# Patient Record
Sex: Female | Born: 1990 | Race: White | Hispanic: No | Marital: Single | Smoking: Never smoker
Health system: Southern US, Community
[De-identification: ages and names within clinical notes are randomized; demographics above are authoritative.]

## PROBLEM LIST (undated history)

## (undated) DIAGNOSIS — F329 Major depressive disorder, single episode, unspecified: Secondary | ICD-10-CM

## (undated) DIAGNOSIS — F319 Bipolar disorder, unspecified: Secondary | ICD-10-CM

## (undated) DIAGNOSIS — O24419 Gestational diabetes mellitus in pregnancy, unspecified control: Secondary | ICD-10-CM

## (undated) DIAGNOSIS — F909 Attention-deficit hyperactivity disorder, unspecified type: Secondary | ICD-10-CM

## (undated) DIAGNOSIS — F32A Depression, unspecified: Secondary | ICD-10-CM

---
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
## 2012-11-14 DIAGNOSIS — E669 Obesity, unspecified: Secondary | ICD-10-CM | POA: Diagnosis present

## 2012-11-14 DIAGNOSIS — Z3009 Encounter for other general counseling and advice on contraception: Secondary | ICD-10-CM | POA: Insufficient documentation

## 2012-11-14 DIAGNOSIS — F988 Other specified behavioral and emotional disorders with onset usually occurring in childhood and adolescence: Secondary | ICD-10-CM | POA: Diagnosis present

## 2012-11-14 DIAGNOSIS — L02419 Cutaneous abscess of limb, unspecified: Secondary | ICD-10-CM

## 2012-11-14 HISTORY — DX: Cutaneous abscess of limb, unspecified: L02.419

## 2013-03-02 DIAGNOSIS — Z8659 Personal history of other mental and behavioral disorders: Secondary | ICD-10-CM

## 2013-03-02 DIAGNOSIS — Z8614 Personal history of Methicillin resistant Staphylococcus aureus infection: Secondary | ICD-10-CM | POA: Insufficient documentation

## 2013-03-02 DIAGNOSIS — O99340 Other mental disorders complicating pregnancy, unspecified trimester: Secondary | ICD-10-CM | POA: Insufficient documentation

## 2014-07-12 DIAGNOSIS — R825 Elevated urine levels of drugs, medicaments and biological substances: Secondary | ICD-10-CM | POA: Insufficient documentation

## 2017-01-06 ENCOUNTER — Other Ambulatory Visit (HOSPITAL_COMMUNITY): Payer: Self-pay | Admitting: *Deleted

## 2017-01-06 DIAGNOSIS — N6452 Nipple discharge: Secondary | ICD-10-CM

## 2017-01-16 ENCOUNTER — Other Ambulatory Visit (HOSPITAL_COMMUNITY): Payer: Self-pay | Admitting: Obstetrics and Gynecology

## 2017-01-16 DIAGNOSIS — N6452 Nipple discharge: Secondary | ICD-10-CM

## 2017-01-17 ENCOUNTER — Ambulatory Visit (HOSPITAL_COMMUNITY): Payer: Self-pay

## 2017-01-17 ENCOUNTER — Other Ambulatory Visit: Payer: Self-pay

## 2017-04-09 ENCOUNTER — Emergency Department (HOSPITAL_BASED_OUTPATIENT_CLINIC_OR_DEPARTMENT_OTHER)
Admission: EM | Admit: 2017-04-09 | Discharge: 2017-04-09 | Disposition: A | Discharge status: Home / Self Care | Payer: Self-pay | Attending: Emergency Medicine | Admitting: Emergency Medicine

## 2017-04-09 ENCOUNTER — Encounter (HOSPITAL_BASED_OUTPATIENT_CLINIC_OR_DEPARTMENT_OTHER): Payer: Self-pay | Admitting: Emergency Medicine

## 2017-04-09 DIAGNOSIS — F17200 Nicotine dependence, unspecified, uncomplicated: Secondary | ICD-10-CM | POA: Insufficient documentation

## 2017-04-09 DIAGNOSIS — N898 Other specified noninflammatory disorders of vagina: Secondary | ICD-10-CM | POA: Insufficient documentation

## 2017-04-09 DIAGNOSIS — N939 Abnormal uterine and vaginal bleeding, unspecified: Secondary | ICD-10-CM | POA: Insufficient documentation

## 2017-04-09 DIAGNOSIS — R112 Nausea with vomiting, unspecified: Secondary | ICD-10-CM

## 2017-04-09 DIAGNOSIS — R197 Diarrhea, unspecified: Secondary | ICD-10-CM | POA: Insufficient documentation

## 2017-04-09 LAB — I-STAT CHEM 8, ED
BUN: 10 mg/dL (ref 6–20)
CREATININE: 0.8 mg/dL (ref 0.44–1.00)
Calcium, Ion: 1.24 mmol/L (ref 1.15–1.40)
Chloride: 104 mmol/L (ref 101–111)
Glucose, Bld: 103 mg/dL — ABNORMAL HIGH (ref 65–99)
HEMATOCRIT: 36 % (ref 36.0–46.0)
HEMOGLOBIN: 12.2 g/dL (ref 12.0–15.0)
Potassium: 3.1 mmol/L — ABNORMAL LOW (ref 3.5–5.1)
SODIUM: 141 mmol/L (ref 135–145)
TCO2: 25 mmol/L (ref 0–100)

## 2017-04-09 LAB — CBC WITH DIFFERENTIAL/PLATELET
BASOS ABS: 0 10*3/uL (ref 0.0–0.1)
BASOS PCT: 0 %
EOS ABS: 0.1 10*3/uL (ref 0.0–0.7)
Eosinophils Relative: 1 %
HCT: 35.2 % — ABNORMAL LOW (ref 36.0–46.0)
HEMOGLOBIN: 11.9 g/dL — AB (ref 12.0–15.0)
Lymphocytes Relative: 30 %
Lymphs Abs: 2.7 10*3/uL (ref 0.7–4.0)
MCH: 29.6 pg (ref 26.0–34.0)
MCHC: 33.8 g/dL (ref 30.0–36.0)
MCV: 87.6 fL (ref 78.0–100.0)
Monocytes Absolute: 0.9 10*3/uL (ref 0.1–1.0)
Monocytes Relative: 10 %
NEUTROS PCT: 59 %
Neutro Abs: 5.3 10*3/uL (ref 1.7–7.7)
Platelets: 256 10*3/uL (ref 150–400)
RBC: 4.02 MIL/uL (ref 3.87–5.11)
RDW: 15.4 % (ref 11.5–15.5)
WBC: 9 10*3/uL (ref 4.0–10.5)

## 2017-04-09 LAB — URINALYSIS, ROUTINE W REFLEX MICROSCOPIC
Bilirubin Urine: NEGATIVE
GLUCOSE, UA: NEGATIVE mg/dL
Ketones, ur: NEGATIVE mg/dL
Leukocytes, UA: NEGATIVE
Nitrite: NEGATIVE
Protein, ur: NEGATIVE mg/dL
SPECIFIC GRAVITY, URINE: 1.027 (ref 1.005–1.030)
pH: 6 (ref 5.0–8.0)

## 2017-04-09 LAB — URINALYSIS, MICROSCOPIC (REFLEX)

## 2017-04-09 LAB — WET PREP, GENITAL
CLUE CELLS WET PREP: NONE SEEN
Sperm: NONE SEEN
Trich, Wet Prep: NONE SEEN
Yeast Wet Prep HPF POC: NONE SEEN

## 2017-04-09 LAB — RAPID HIV SCREEN (HIV 1/2 AB+AG)
HIV 1/2 ANTIBODIES: NONREACTIVE
HIV-1 P24 ANTIGEN - HIV24: NONREACTIVE

## 2017-04-09 LAB — PREGNANCY, URINE: Preg Test, Ur: NEGATIVE

## 2017-04-09 MED ORDER — ONDANSETRON HCL 4 MG PO TABS: 4.0000 mg | ORAL_TABLET | Freq: Three times a day (TID) | ORAL | 0 refills | Status: DC | PRN

## 2017-04-09 MED ORDER — SODIUM CHLORIDE 0.9 % IV BOLUS (SEPSIS)
1000.0000 mL | Freq: Once | INTRAVENOUS | Status: AC
  Administered 2017-04-09: 1000 mL via INTRAVENOUS

## 2017-04-09 MED ORDER — ONDANSETRON HCL 4 MG/2ML IJ SOLN
4.0000 mg | Freq: Once | INTRAMUSCULAR | Status: AC
  Administered 2017-04-09: 4 mg via INTRAVENOUS
  Filled 2017-04-09: qty 2

## 2017-04-09 NOTE — ED Provider Notes (Signed)
[facility] Provider [facility]   CSN: [id number] Arrival date & time: 04/09/17  4098     History   Chief Complaint Chief Complaint  Patient presents with  . Emesis    HPI Angela [name] is a 26 y.o. female.  The history is provided by the patient and medical records.  Emesis   This is a new problem. The current episode started 2 days ago. The problem occurs continuously. The problem has not changed since onset.The emesis has an appearance of stomach contents. There has been no fever. Associated symptoms include abdominal pain and diarrhea. Pertinent negatives include no chills, no cough, no fever, no headaches, no myalgias, no sweats and no URI. Risk factors include ill contacts.  Diarrhea   This is a new problem. The current episode started 2 days ago. The problem occurs continuously. The problem has not changed since onset.The stool consistency is described as watery. There has been no fever. Associated symptoms include abdominal pain and vomiting. Pertinent negatives include no chills, no sweats, no headaches, no myalgias, no URI and no cough. She has tried nothing for the symptoms. Risk factors include ill contacts.    History reviewed. No pertinent past medical history.  There are no active problems to display for this patient.   History reviewed. No pertinent surgical history.  OB History    No data available       Home Medications    Prior to Admission medications   Not on File    Family History No family history on file.  Social History Social History  Substance Use Topics  . Smoking status: Current Every Day Smoker  . Smokeless tobacco: Never Used  . Alcohol use Yes     Allergies   Patient has no known allergies.   Review of Systems Review of Systems  Constitutional: Negative for appetite change, chills, diaphoresis, fatigue and fever.  HENT: Negative for congestion and rhinorrhea.   Eyes: Negative for visual disturbance.  Respiratory:  Negative for cough, choking, chest tightness, shortness of breath and wheezing.   Cardiovascular: Negative for chest pain and palpitations.  Gastrointestinal: Positive for abdominal pain, diarrhea, nausea and vomiting. Negative for constipation.  Genitourinary: Positive for vaginal bleeding and vaginal discharge. Negative for dysuria.  Musculoskeletal: Negative for back pain, myalgias, neck pain and neck stiffness.  Skin: Negative for rash and wound.  Neurological: Negative for dizziness, syncope, light-headedness, numbness and headaches.  Psychiatric/Behavioral: Negative for agitation.  All other systems reviewed and are negative.    Physical Exam Updated Vital Signs BP 120/73   Pulse 73   Temp 97.9 F (36.6 C) (Oral)   Resp 18   Ht  (1.626 m)   Wt 200 lb (90.7 kg)   SpO2 98%   BMI 34.33 kg/m   Physical Exam  Constitutional: She is oriented to person, place, and time. She appears well-developed and well-nourished. No distress.  HENT:  Head: Normocephalic and atraumatic.  Mouth/Throat: Oropharynx is clear and moist. No oropharyngeal exudate.  Eyes: Conjunctivae and EOM are normal. Pupils are equal, round, and reactive to light.  Neck: Neck supple.  Cardiovascular: Normal rate and regular rhythm.   No murmur heard. Pulmonary/Chest: Effort normal and breath sounds normal. No respiratory distress.  Abdominal: Soft. There is no tenderness. There is no rebound.  Genitourinary: Pelvic exam was performed with patient supine. There is no tenderness on the right labia. There is no tenderness on the left labia. Cervix exhibits discharge (clear). Cervix exhibits no  motion tenderness. Right adnexum displays no mass, no tenderness and no fullness. Left adnexum displays no mass, no tenderness and no fullness. There is bleeding in the vagina. No erythema or tenderness in the vagina. No foreign body in the vagina. Vaginal discharge found.  Musculoskeletal: Normal range of motion. She  exhibits no edema or tenderness.  Neurological: She is alert and oriented to person, place, and time. No sensory deficit. She exhibits normal muscle tone. Coordination normal.  Skin: Skin is warm and dry. Capillary refill takes less than 2 seconds. No rash noted. She is not diaphoretic. No erythema.  Psychiatric: She has a normal mood and affect. Her behavior is normal.  Nursing note and vitals reviewed.    ED Treatments / Results  Labs (all labs ordered are listed, but only abnormal results are displayed) Labs Reviewed  WET PREP, GENITAL - Abnormal; Notable for the following:       Result Value   WBC, Wet Prep HPF POC MANY (*)    All other components within normal limits  URINALYSIS, ROUTINE W REFLEX MICROSCOPIC - Abnormal; Notable for the following:    Hgb urine dipstick LARGE (*)    All other components within normal limits  URINALYSIS, MICROSCOPIC (REFLEX) - Abnormal; Notable for the following:    Bacteria, UA FEW (*)    Squamous Epithelial / LPF 0-5 (*)    All other components within normal limits  CBC WITH DIFFERENTIAL/PLATELET - Abnormal; Notable for the following:    Hemoglobin 11.9 (*)    HCT 35.2 (*)    All other components within normal limits  I-STAT CHEM 8, ED - Abnormal; Notable for the following:    Potassium 3.1 (*)    Glucose, Bld 103 (*)    All other components within normal limits  PREGNANCY, URINE  RAPID HIV SCREEN (HIV 1/2 AB+AG)  RPR  HIV ANTIBODY (ROUTINE TESTING)  GC/CHLAMYDIA PROBE AMP ([place]) NOT AT [facility]    EKG  EKG Interpretation None       Radiology No results found.  Procedures Procedures (including critical care time)  Medications Ordered in ED Medications  sodium chloride 0.9 % bolus 1,000 mL (0 mLs Intravenous Stopped 04/09/17 1126)  ondansetron (ZOFRAN) injection 4 mg (4 mg Intravenous Given 04/09/17 0806)     Initial Impression / Assessment and Plan / ED Course  I have reviewed the triage vital signs and the nursing  notes.  Pertinent labs & imaging results that were available during my care of the patient were reviewed by me and considered in my medical decision making (see chart for details).     Angela [name] is a 26 y.o. female with no significant past medical history who presents with nausea, vomiting, and diarrhea for the last 3 days. Patient says that "everyone at work" has been sick with similar symptoms. She also reports that she has had some vaginal discharge for the last month. She reports that she has an IUD in place for the last 5 years and needs to have a change. She denies any pelvic pain but does report some mild clear vaginal discharge. Patient reports a mild abdominal cramping associated with her vomiting and diarrhea. She denies any bleeding in her emesis or diarrhea. She denies any other symptoms. She denies fevers, chills, chest pain, shortness of breath, cough. She denies any syncopal episodes. She has not taken medicine to help her symptoms.  On exam, patient had no abdominal tenderness. Pelvic exam was performed and some mild bleeding  was seen by from the os. No lacerations or injury seen. Clear discharge present. Swab sent for evaluation. No adnexal tenderness or cervical motion tenderness present. Physical exam otherwise unremarkable with no CVA tenderness and clear lungs.  Patient will have laboratory testing to look for dehydration or electrolyte abnormalities in the setting of her nausea, vomiting, and diarrhea. Urinalysis, print success, and STI testing will be performed. Patient wanted to be "tested for everything sexually". She denies any sexual contacts and has had 1 partner for the last 2 years.  Patient given fluids and nausea medicine during workup. Anticipate discharge after workup.  Diagnostic testing results are seen above. No evidence of DVT or trichomoniasis. No yeast infection. GC/chlamydia swab sent. RPR and HIV test sent. Patient will follow up with PCP for those  results.  Pelvic exam revealed no CMT or adnexal tenderness. Patient had mild bleeding and clear discharge. Patient will follow up with OB/GYN for further management of this and her IUD.  Other lab testing reassuring. Patient had slightly decreased potassium. Patient advised to improve diet. Urinalysis showed no infection.  Patient had resolution of nausea and vomiting with fluids and nausea medications.  Suspect a viral gastroenteritis causing her symptoms. Patient given instructions to follow up with a PCP. Patient understood strict return precautions. Patient had no other questions or concerns and patient was discharged in good condition.   Final Clinical Impressions(s) / ED Diagnoses   Final diagnoses:  Nausea vomiting and diarrhea    New Prescriptions Discharge Medication List as of 04/09/2017 11:26 AM    START taking these medications   Details  ondansetron (ZOFRAN) 4 MG tablet Take 1 tablet (4 mg total) by mouth every 8 (eight) hours as needed., Starting Wed 04/09/2017, Print        Clinical Impression: 1. Nausea vomiting and diarrhea     Disposition: Discharge  Condition: Good  I have discussed the results, Dx and Tx plan with the pt(& family if present). He/she/they expressed understanding and agree(s) with the plan. Discharge instructions discussed at great length. Strict return precautions discussed and pt &/or family have verbalized understanding of the instructions. No further questions at time of discharge.    Discharge Medication List as of 04/09/2017 11:26 AM    START taking these medications   Details  ondansetron (ZOFRAN) 4 MG tablet Take 1 tablet (4 mg total) by mouth every 8 (eight) hours as needed., Starting Wed 04/09/2017, Print        Follow Up: [facility] [address] [place] [address] [phone] Schedule an appointment as soon as possible for a visit    [facility] [place] EMERGENCY  DEPARTMENT [address] [phone] mc [address] [place] [address] [phone]  If symptoms worsen  [facility] [address] [place] [address] [phone] Schedule an appointment as soon as possible for a visit       Heide Scales, MD 04/09/17 [phone]

## 2017-04-09 NOTE — ED Notes (Signed)
Pt requesting work note in triage

## 2017-04-09 NOTE — ED Notes (Signed)
Pt c/o n/v/d that was unrelieved by pepto bismol.  Pt is in no acute distress, mucosa is moist, she is drinking mountain dew and lying on her stomach on the stretcher, playing on phone.

## 2017-04-09 NOTE — ED Notes (Signed)
Pt reports diarrhea and vomiting yesterday, followed by abd pain.

## 2017-04-09 NOTE — Discharge Instructions (Signed)
Please take the nausea medicine to help with your symptoms. Please stay hydrated. Please continue to wash her hands to prevent spread of her likely viral gastroenteritis. Please rest. Please follow-up with a PCP as well as an OB/GYN for further management of your IUD. If any symptoms change or worsen, please return to the nearest emergency department.

## 2017-04-09 NOTE — ED Triage Notes (Signed)
Pt with n/v/d and chills

## 2017-04-10 LAB — RPR: RPR Ser Ql: NONREACTIVE

## 2017-04-10 LAB — HIV ANTIBODY (ROUTINE TESTING W REFLEX): HIV SCREEN 4TH GENERATION: NONREACTIVE

## 2017-04-11 LAB — GC/CHLAMYDIA PROBE AMP (~~LOC~~) NOT AT ARMC
Chlamydia: NEGATIVE
NEISSERIA GONORRHEA: NEGATIVE

## 2018-02-13 ENCOUNTER — Emergency Department (HOSPITAL_COMMUNITY): Payer: Self-pay

## 2018-02-13 ENCOUNTER — Encounter (HOSPITAL_COMMUNITY): Payer: Self-pay

## 2018-02-13 ENCOUNTER — Other Ambulatory Visit: Payer: Self-pay

## 2018-02-13 ENCOUNTER — Emergency Department (HOSPITAL_COMMUNITY)
Admission: EM | Admit: 2018-02-13 | Discharge: 2018-02-13 | Disposition: A | Discharge status: Home / Self Care | Payer: Self-pay | Attending: Emergency Medicine | Admitting: Emergency Medicine

## 2018-02-13 DIAGNOSIS — Y999 Unspecified external cause status: Secondary | ICD-10-CM | POA: Insufficient documentation

## 2018-02-13 DIAGNOSIS — X500XXA Overexertion from strenuous movement or load, initial encounter: Secondary | ICD-10-CM | POA: Insufficient documentation

## 2018-02-13 DIAGNOSIS — Y9389 Activity, other specified: Secondary | ICD-10-CM | POA: Insufficient documentation

## 2018-02-13 DIAGNOSIS — F17210 Nicotine dependence, cigarettes, uncomplicated: Secondary | ICD-10-CM | POA: Insufficient documentation

## 2018-02-13 DIAGNOSIS — A64 Unspecified sexually transmitted disease: Secondary | ICD-10-CM | POA: Insufficient documentation

## 2018-02-13 DIAGNOSIS — Y92018 Other place in single-family (private) house as the place of occurrence of the external cause: Secondary | ICD-10-CM | POA: Insufficient documentation

## 2018-02-13 DIAGNOSIS — S8391XA Sprain of unspecified site of right knee, initial encounter: Secondary | ICD-10-CM | POA: Insufficient documentation

## 2018-02-13 HISTORY — DX: Depression, unspecified: F32.A

## 2018-02-13 HISTORY — DX: Major depressive disorder, single episode, unspecified: F32.9

## 2018-02-13 HISTORY — DX: Bipolar disorder, unspecified: F31.9

## 2018-02-13 LAB — WET PREP, GENITAL
SPERM: NONE SEEN
TRICH WET PREP: NONE SEEN
Yeast Wet Prep HPF POC: NONE SEEN

## 2018-02-13 LAB — URINALYSIS, ROUTINE W REFLEX MICROSCOPIC
Bilirubin Urine: NEGATIVE
Glucose, UA: NEGATIVE mg/dL
Ketones, ur: 20 mg/dL — AB
LEUKOCYTES UA: NEGATIVE
Nitrite: POSITIVE — AB
PH: 6 (ref 5.0–8.0)
Protein, ur: NEGATIVE mg/dL
SPECIFIC GRAVITY, URINE: 1.023 (ref 1.005–1.030)

## 2018-02-13 LAB — PREGNANCY, URINE: PREG TEST UR: NEGATIVE

## 2018-02-13 MED ORDER — LIDOCAINE HCL 1 % IJ SOLN
INTRAMUSCULAR | Status: AC
  Administered 2018-02-13: 17:00:00
  Filled 2018-02-13: qty 20

## 2018-02-13 MED ORDER — DOXYCYCLINE HYCLATE 100 MG PO TABS
100.0000 mg | ORAL_TABLET | Freq: Once | ORAL | Status: AC
  Administered 2018-02-13: 100 mg via ORAL
  Filled 2018-02-13: qty 1

## 2018-02-13 MED ORDER — CEFTRIAXONE SODIUM 250 MG IJ SOLR
250.0000 mg | Freq: Once | INTRAMUSCULAR | Status: AC
  Administered 2018-02-13: 250 mg via INTRAMUSCULAR
  Filled 2018-02-13: qty 250

## 2018-02-13 MED ORDER — METRONIDAZOLE 500 MG PO TABS
500.0000 mg | ORAL_TABLET | Freq: Once | ORAL | Status: AC
  Administered 2018-02-13: 500 mg via ORAL
  Filled 2018-02-13: qty 1

## 2018-02-13 MED ORDER — METRONIDAZOLE 500 MG PO TABS: 500.0000 mg | ORAL_TABLET | Freq: Two times a day (BID) | ORAL | 0 refills | Status: DC

## 2018-02-13 MED ORDER — IBUPROFEN 800 MG PO TABS: 800.0000 mg | ORAL_TABLET | Freq: Three times a day (TID) | ORAL | 0 refills | Status: DC

## 2018-02-13 MED ORDER — IBUPROFEN 800 MG PO TABS
800.0000 mg | ORAL_TABLET | Freq: Once | ORAL | Status: AC
Start: 2018-02-13 — End: 2018-02-13
  Administered 2018-02-13: 800 mg via ORAL
  Filled 2018-02-13: qty 1

## 2018-02-13 MED ORDER — DOXYCYCLINE HYCLATE 100 MG PO CAPS: 100.0000 mg | ORAL_CAPSULE | Freq: Two times a day (BID) | ORAL | 0 refills | Status: DC

## 2018-02-13 NOTE — ED Provider Notes (Signed)
[facility]-EMERGENCY DEPT Provider Note   CSN: [id number] Arrival date & time: 02/13/18  1053     History   Chief Complaint Chief Complaint  Patient presents with  . Knee Pain  . Vaginal Discharge    HPI Angela [name] is a 27 y.o. female.  HPI Patient has 2 complaints.  She reports yesterday she was caring a load of wood down the basement stairs.  She misjudged the last step causing her right knee to get bent way back.  She reports she felt a snap.  It was painful but she was able to go to work that night.  She reports after long-standing it became very painful.  Reports today is almost too painful to put weight on it.  Patient also reports that her boyfriend just let her know that he tested positive for chlamydia.  She reports she had some vaginal discharge for about 2 weeks.  She reports mild lower abdominal pain.  She however reports that she is on her menstrual cycle right now and also she suspects she has endometriosis because she has frequent pelvic pain and her mother has that.  She reports due to limited finances she never really seeks ongoing medical care evaluation. Past Medical History:  Diagnosis Date  . Bipolar 1 disorder (HCC)   . Depression     There are no active problems to display for this patient.   History reviewed. No pertinent surgical history.  OB History    No data available       Home Medications    Prior to Admission medications   Medication Sig Start Date End Date Taking? Authorizing Provider  doxycycline (VIBRAMYCIN) 100 MG capsule Take 1 capsule (100 mg total) by mouth 2 (two) times daily. One po bid x 7 days 02/13/18   Arby BarrettePfeiffer, Tearah Saulsbury, MD  ibuprofen (ADVIL,MOTRIN) 800 MG tablet Take 1 tablet (800 mg total) by mouth 3 (three) times daily. 02/13/18   Arby BarrettePfeiffer, Keiara Sneeringer, MD  metroNIDAZOLE (FLAGYL) 500 MG tablet Take 1 tablet (500 mg total) by mouth 2 (two) times daily. One po bid x 7 days 02/13/18   Arby BarrettePfeiffer, Shyenne Maggard, MD  ondansetron  (ZOFRAN) 4 MG tablet Take 1 tablet (4 mg total) by mouth every 8 (eight) hours as needed. 04/09/17   Tegeler, Canary Brimhristopher J, MD    Family History Family History  Problem Relation Age of Onset  . COPD Mother   . Hypertension Father     Social History Social History   Tobacco Use  . Smoking status: Current Every Day Smoker    Packs/day: 0.15    Types: Cigarettes  . Smokeless tobacco: Never Used  Substance Use Topics  . Alcohol use: Yes    Comment: every weekend  . Drug use: No     Allergies   Patient has no known allergies.   Review of Systems Review of Systems 10 Systems reviewed and are negative for acute change except as noted in the HPI.   Physical Exam Updated Vital Signs BP 100/79   Pulse 63   Temp 98 F (36.7 C) (Oral)   Resp (!) 22   Ht 5\' 4"  (1.626 m)   Wt 90.7 kg (200 lb)   LMP 02/11/2018   SpO2 99%   BMI 34.33 kg/m   Physical Exam  Constitutional: She is oriented to person, place, and time. She appears well-developed and well-nourished. No distress.  HENT:  Head: Normocephalic and atraumatic.  Eyes: Conjunctivae and EOM are normal.  Neck:  Neck supple.  Cardiovascular: Normal rate, regular rhythm and normal heart sounds.  No murmur heard. Pulmonary/Chest: Effort normal and breath sounds normal. No respiratory distress.  Abdominal: Soft. She exhibits no distension. There is no tenderness. There is no guarding.  Genitourinary:  Genitourinary Comments: Normal external female genitalia.  Speculum small amount of yellow discharge and blood from the cervix.  No pooling drainage or discharge.  Bimanual mild uterine tenderness.  Adnexa nontender.  Musculoskeletal: Normal range of motion. She exhibits tenderness. She exhibits no edema.  Right knee is normal to visual inspection.  No joint effusion or erythema.  Patient endorses tenderness along the patellar tendon and the lateral joint lines.  She does not have tenderness with flexion and extension range of  motion.  Patient is able to flex her knee and position it for pelvic exam.  She also has good strength to use the leg symmetrically to elevate her hips and push her full body weight back up into the stretcher.  Neurological: She is alert and oriented to person, place, and time. She exhibits normal muscle tone. Coordination normal.  Skin: Skin is warm and dry.  Psychiatric: She has a normal mood and affect.  Nursing note and vitals reviewed.    ED Treatments / Results  Labs (all labs ordered are listed, but only abnormal results are displayed) Labs Reviewed  URINALYSIS, ROUTINE W REFLEX MICROSCOPIC - Abnormal; Notable for the following components:      Result Value   Hgb urine dipstick MODERATE (*)    Ketones, ur 20 (*)    Nitrite POSITIVE (*)    Bacteria, UA MANY (*)    Squamous Epithelial / LPF 0-5 (*)    All other components within normal limits  WET PREP, GENITAL  PREGNANCY, URINE  RPR  HIV ANTIBODY (ROUTINE TESTING)  GC/CHLAMYDIA PROBE AMP () NOT AT [facility]    EKG  EKG Interpretation None       Radiology Dg Knee Complete 4 Views Right  Result Date: 02/13/2018 CLINICAL DATA:  Pt reports hyperextension of right knee when she fell off a truck 1 days ago - pain through out joint - pt states unable to keep leg straight EXAM: RIGHT KNEE - COMPLETE 4+ VIEW COMPARISON:  None. FINDINGS: No evidence of fracture, dislocation, or joint effusion. No evidence of arthropathy or other focal bone abnormality. Soft tissues are unremarkable. IMPRESSION: Negative. Electronically Signed   By: Bary Richard M.D.   On: 02/13/2018 11:49    Procedures Procedures (including critical care time)  Medications Ordered in ED Medications  lidocaine (XYLOCAINE) 1 % (with pres) injection (not administered)  cefTRIAXone (ROCEPHIN) injection 250 mg (250 mg Intramuscular Given 02/13/18 1556)  doxycycline (VIBRA-TABS) tablet 100 mg (100 mg Oral Given 02/13/18 1556)  metroNIDAZOLE (FLAGYL) tablet 500  mg (500 mg Oral Given 02/13/18 1556)  ibuprofen (ADVIL,MOTRIN) tablet 800 mg (800 mg Oral Given 02/13/18 1556)     Initial Impression / Assessment and Plan / ED Course  I have reviewed the triage vital signs and the nursing notes.  Pertinent labs & imaging results that were available during my care of the patient were reviewed by me and considered in my medical decision making (see chart for details).     Final Clinical Impressions(s) / ED Diagnoses   Final diagnoses:  STD (female)  Sprain of right knee, unspecified ligament, initial encounter   Reports she sprained her knee yesterday at home walking downstairs.  X-ray does not show any acute fracture.  Patient has good range of motion.  Patient does not have active effusion.  Pain is most precipitated by weightbearing.  Patient be placed in a knee sleeve and crutches for comfort.  Patient presents reporting that her boyfriend told her he tested positive chlamydia.  She will be treated empirically for STD.  She describes about 2 weeks of vaginal discharge.  Pelvic has mild uterine tenderness but not adnexal tenderness.  Patient is afebrile and clinically well in appearance.  We will continue outpatient treatment. ED Discharge Orders        Ordered    doxycycline (VIBRAMYCIN) 100 MG capsule  2 times daily     02/13/18 1518    metroNIDAZOLE (FLAGYL) 500 MG tablet  2 times daily     02/13/18 1518    ibuprofen (ADVIL,MOTRIN) 800 MG tablet  3 times daily     02/13/18 1518       Arby Barrette, MD 02/13/18 [phone]

## 2018-02-13 NOTE — ED Notes (Signed)
Bed: WTR6 Expected date:  Expected time:  Means of arrival:  Comments: 

## 2018-02-13 NOTE — ED Triage Notes (Signed)
Patient states she was stepping off of a pallet and hyperextended her righ. patient has increased pain in the right knee with weight bearing. Patient also reports that her boyfriend tested + for chlamydia. Patient reports a foul smelling yellow vaginal drainage x 2 weeks.

## 2018-02-14 LAB — HIV ANTIBODY (ROUTINE TESTING W REFLEX): HIV SCREEN 4TH GENERATION: NONREACTIVE

## 2018-02-14 LAB — RPR: RPR: NONREACTIVE

## 2018-02-16 LAB — GC/CHLAMYDIA PROBE AMP (~~LOC~~) NOT AT ARMC
Chlamydia: POSITIVE — AB
Neisseria Gonorrhea: NEGATIVE

## 2018-03-22 DIAGNOSIS — T839XXA Unspecified complication of genitourinary prosthetic device, implant and graft, initial encounter: Secondary | ICD-10-CM | POA: Insufficient documentation

## 2018-11-24 DIAGNOSIS — R45851 Suicidal ideations: Secondary | ICD-10-CM

## 2018-11-24 DIAGNOSIS — F603 Borderline personality disorder: Secondary | ICD-10-CM

## 2018-11-24 HISTORY — DX: Suicidal ideations: R45.851

## 2018-11-24 HISTORY — DX: Borderline personality disorder: F60.3

## 2018-11-25 DIAGNOSIS — F3181 Bipolar II disorder: Secondary | ICD-10-CM | POA: Diagnosis present

## 2018-11-25 DIAGNOSIS — F1210 Cannabis abuse, uncomplicated: Secondary | ICD-10-CM | POA: Diagnosis present

## 2019-06-20 ENCOUNTER — Encounter (HOSPITAL_BASED_OUTPATIENT_CLINIC_OR_DEPARTMENT_OTHER): Payer: Self-pay | Admitting: *Deleted

## 2019-06-20 ENCOUNTER — Other Ambulatory Visit: Payer: Self-pay

## 2019-06-20 ENCOUNTER — Emergency Department (HOSPITAL_BASED_OUTPATIENT_CLINIC_OR_DEPARTMENT_OTHER)
Admission: EM | Admit: 2019-06-20 | Discharge: 2019-06-20 | Disposition: A | Discharge status: Home / Self Care | Payer: BC Managed Care – PPO | Attending: Emergency Medicine | Admitting: Emergency Medicine

## 2019-06-20 DIAGNOSIS — Z87891 Personal history of nicotine dependence: Secondary | ICD-10-CM | POA: Diagnosis not present

## 2019-06-20 DIAGNOSIS — K0889 Other specified disorders of teeth and supporting structures: Secondary | ICD-10-CM | POA: Diagnosis present

## 2019-06-20 MED ORDER — CLINDAMYCIN HCL 300 MG PO CAPS: 300.0000 mg | ORAL_CAPSULE | Freq: Three times a day (TID) | ORAL | 0 refills | Status: AC

## 2019-06-20 MED ORDER — KETOROLAC TROMETHAMINE 60 MG/2ML IM SOLN
60.0000 mg | Freq: Once | INTRAMUSCULAR | Status: AC
Start: 2019-06-20 — End: 2019-06-20
  Administered 2019-06-20: 60 mg via INTRAMUSCULAR
  Filled 2019-06-20: qty 2

## 2019-06-20 NOTE — ED Provider Notes (Addendum)
[facility] Provider Note   CSN: [id number] Arrival date & time: 06/20/19  1550    History   Chief Complaint Chief Complaint  Patient presents with   Dental Pain    HPI Angela [name] is a 28 y.o. female.     The history is provided by the patient.  Dental Pain Location:  Lower Quality:  Aching Severity:  Mild Onset quality:  Gradual Timing:  Intermittent Progression:  Waxing and waning Chronicity:  New Context: dental caries (wisdom tooth pain )   Relieved by:  Nothing Worsened by:  Nothing Associated symptoms: no congestion, no drooling and no fever     Past Medical History:  Diagnosis Date   Bipolar 1 disorder ([place])    Depression     There are no active problems to display for this patient.   History reviewed. No pertinent surgical history.   OB History   No obstetric history on file.      Home Medications    Prior to Admission medications   Medication Sig Start Date End Date Taking? Authorizing Provider  clindamycin (CLEOCIN) 300 MG capsule Take 1 capsule (300 mg total) by mouth 3 (three) times daily for 10 days. 06/20/19 06/30/19  Kshawn Canal, DO  doxycycline (VIBRAMYCIN) 100 MG capsule Take 1 capsule (100 mg total) by mouth 2 (two) times daily. One po bid x 7 days 02/13/18   Charlesetta Shanks, MD  ibuprofen (ADVIL,MOTRIN) 800 MG tablet Take 1 tablet (800 mg total) by mouth 3 (three) times daily. 02/13/18   Charlesetta Shanks, MD  metroNIDAZOLE (FLAGYL) 500 MG tablet Take 1 tablet (500 mg total) by mouth 2 (two) times daily. One po bid x 7 days 02/13/18   Charlesetta Shanks, MD  ondansetron (ZOFRAN) 4 MG tablet Take 1 tablet (4 mg total) by mouth every 8 (eight) hours as needed. 04/09/17   Tegeler, Gwenyth Allegra, MD    Family History Family History  Problem Relation Age of Onset   COPD Mother    Hypertension Father     Social History Social History   Tobacco Use   Smoking status: Former Smoker    Packs/day: 0.15   Types: Cigarettes   Smokeless tobacco: Never Used   Tobacco comment: reports that she stopped June 2020  Substance Use Topics   Alcohol use: Yes    Comment: every weekend   Drug use: No     Allergies   Patient has no known allergies.   Review of Systems Review of Systems  Constitutional: Negative for chills and fever.  HENT: Positive for dental problem. Negative for congestion, drooling, ear pain and sore throat.   Eyes: Negative for pain and visual disturbance.  Respiratory: Negative for cough and shortness of breath.   Cardiovascular: Negative for chest pain and palpitations.  Gastrointestinal: Negative for abdominal pain and vomiting.  Genitourinary: Negative for dysuria and hematuria.  Musculoskeletal: Negative for arthralgias and back pain.  Skin: Negative for color change and rash.  Neurological: Negative for seizures and syncope.  All other systems reviewed and are negative.    Physical Exam Updated Vital Signs BP (!) 135/91 (BP Location: Left Arm)    Pulse 91    Temp 98.6 F (37 C) (Oral)    Resp 18    Ht 5\' 4"  (1.626 m)    Wt 59 kg    LMP 05/29/2019    SpO2 100%    BMI 22.31 kg/m   Physical Exam Vitals signs and nursing note  reviewed.  Constitutional:      General: She is not in acute distress.    Appearance: She is well-developed.  HENT:     Head: Normocephalic and atraumatic.     Comments: Possibly impacted wisdom teeth, dental caries to the left bottom, no obvious abscess in the mouth    Right Ear: Tympanic membrane normal. There is no impacted cerumen.     Left Ear: Tympanic membrane normal.     Nose: Nose normal.     Mouth/Throat:     Mouth: Mucous membranes are moist.  Eyes:     Conjunctiva/sclera: Conjunctivae normal.     Pupils: Pupils are equal, round, and reactive to light.  Neck:     Musculoskeletal: Neck supple.  Cardiovascular:     Rate and Rhythm: Normal rate and regular rhythm.     Heart sounds: No murmur.  Pulmonary:     Effort:  Pulmonary effort is normal. No respiratory distress.     Breath sounds: Normal breath sounds.  Abdominal:     Palpations: Abdomen is soft.     Tenderness: There is no abdominal tenderness.  Skin:    General: Skin is warm and dry.  Neurological:     Mental Status: She is alert.      ED Treatments / Results  Labs (all labs ordered are listed, but only abnormal results are displayed) Labs Reviewed - No data to display  EKG None  Radiology No results found.  Procedures Procedures (including critical care time)  Medications Ordered in ED Medications - No data to display   Initial Impression / Assessment and Plan / ED Course  I have reviewed the triage vital signs and the nursing notes.  Pertinent labs & imaging results that were available during my care of the patient were reviewed by me and considered in my medical decision making (see chart for details).        Wilber OliphantFreddie Pepin is a 28 year old female who presents to the ED with lower tooth pain.  Has may be impacted wisdom teeth.  No obvious infection.  Does have some poor dentition.  Does have a In the back of her left molar as well.  Overall her teeth appear intact.  No obvious pulpitis.  Patient appears to have referred ear pain.  TMs are clear.  No mastoiditis.  No otitis externa.  Given clindamycin.  Given resources for dental clinic.  Discharged in good condition.  Recommend Tylenol Motrin for pain. Given IM toradol, denies pregnancy.  This chart was dictated using voice recognition software.  Despite best efforts to proofread,  errors can occur which can change the documentation meaning.    Final Clinical Impressions(s) / ED Diagnoses   Final diagnoses:  Pain, dental    ED Discharge Orders         Ordered    clindamycin (CLEOCIN) 300 MG capsule  3 times daily     06/20/19 1620           Virgina NorfolkCuratolo, Toniqua Melamed, DO 06/20/19 1624    Lockie Molauratolo, Ermin Parisien, DO 06/20/19 1624    Dyllan Kats, DO 06/20/19 1626

## 2019-06-20 NOTE — ED Notes (Signed)
ED Provider at bedside. 

## 2019-06-20 NOTE — ED Notes (Signed)
Pt verbalized understanding of dc instructions.

## 2019-06-20 NOTE — ED Notes (Signed)
Pt became upset when being discharged d/t requesting pain medication. Pt refused to let this RN discharge, requested EDP, stating she came here to be given pain medication. Agricultural consultant notified.

## 2019-06-20 NOTE — ED Triage Notes (Signed)
Left dental pain with left ear pain x 2 days. Reports broken tooth.

## 2019-06-23 ENCOUNTER — Encounter (HOSPITAL_BASED_OUTPATIENT_CLINIC_OR_DEPARTMENT_OTHER): Payer: Self-pay

## 2019-06-23 ENCOUNTER — Other Ambulatory Visit: Payer: Self-pay

## 2019-06-23 ENCOUNTER — Emergency Department (HOSPITAL_BASED_OUTPATIENT_CLINIC_OR_DEPARTMENT_OTHER)
Admission: EM | Admit: 2019-06-23 | Discharge: 2019-06-23 | Disposition: A | Discharge status: Home / Self Care | Payer: BC Managed Care – PPO | Attending: Emergency Medicine | Admitting: Emergency Medicine

## 2019-06-23 DIAGNOSIS — N390 Urinary tract infection, site not specified: Secondary | ICD-10-CM | POA: Diagnosis not present

## 2019-06-23 DIAGNOSIS — N760 Acute vaginitis: Secondary | ICD-10-CM | POA: Insufficient documentation

## 2019-06-23 DIAGNOSIS — B9689 Other specified bacterial agents as the cause of diseases classified elsewhere: Secondary | ICD-10-CM | POA: Diagnosis not present

## 2019-06-23 DIAGNOSIS — R350 Frequency of micturition: Secondary | ICD-10-CM | POA: Insufficient documentation

## 2019-06-23 DIAGNOSIS — R300 Dysuria: Secondary | ICD-10-CM | POA: Insufficient documentation

## 2019-06-23 DIAGNOSIS — Z87891 Personal history of nicotine dependence: Secondary | ICD-10-CM | POA: Insufficient documentation

## 2019-06-23 DIAGNOSIS — N898 Other specified noninflammatory disorders of vagina: Secondary | ICD-10-CM | POA: Diagnosis present

## 2019-06-23 LAB — URINALYSIS, ROUTINE W REFLEX MICROSCOPIC
Glucose, UA: 100 mg/dL — AB
Hgb urine dipstick: NEGATIVE
Ketones, ur: NEGATIVE mg/dL
Leukocytes,Ua: NEGATIVE
Nitrite: POSITIVE — AB
Protein, ur: 30 mg/dL — AB
Specific Gravity, Urine: 1.025 (ref 1.005–1.030)
pH: 7 (ref 5.0–8.0)

## 2019-06-23 LAB — URINALYSIS, MICROSCOPIC (REFLEX)

## 2019-06-23 LAB — WET PREP, GENITAL
Sperm: NONE SEEN
Trich, Wet Prep: NONE SEEN
Yeast Wet Prep HPF POC: NONE SEEN

## 2019-06-23 LAB — PREGNANCY, URINE: Preg Test, Ur: NEGATIVE

## 2019-06-23 MED ORDER — SULFAMETHOXAZOLE-TRIMETHOPRIM 800-160 MG PO TABS: 1.0000 | ORAL_TABLET | Freq: Two times a day (BID) | ORAL | 0 refills | Status: AC

## 2019-06-23 NOTE — Discharge Instructions (Signed)
Continue taking the Clindamycin as prescribed at your last visit. This medicine helps with dental infections as well as bacterial vaginosis. Take Septra as prescribed and complete the full course- this is to treat your urinary tract infection.  Follow up with your doctor or the health department for further testing or treatment if needed. Call for your test results or check your mychart in 3-5 days for your chlamydia and gonorrhea test results, if this is positive you need to go to the health department for treatment.

## 2019-06-23 NOTE — ED Triage Notes (Signed)
C/o vaginal d/c-dysuria x 5 days-NAD-steady gait

## 2019-06-23 NOTE — ED Provider Notes (Signed)
[facility] Provider Note   CSN: [id number] Arrival date & time: 06/23/19  1227    History   Chief Complaint Chief Complaint  Patient presents with  . Vaginal Discharge    HPI Angela [name] is a 28 y.o. female.     28 year old female presents with complaint of dysuria and vaginal discharge.  Patient states her symptoms started 5 days ago, she has been using Monistat and taking AZO but her symptoms persist.  Patient is sexually active, denies new partners.  Unable to describe vaginal discharge due to use of the Monistat cream.  Patient denies fevers, abdominal pain, changes in bowel patterns.  No other complaints or concerns.  Patient was seen in this emergency room 3 days ago and given antibiotics for a dental infection.     Past Medical History:  Diagnosis Date  . Bipolar 1 disorder ([place])   . Depression     There are no active problems to display for this patient.   History reviewed. No pertinent surgical history.   OB History   No obstetric history on file.      Home Medications    Prior to Admission medications   Medication Sig Start Date End Date Taking? Authorizing Provider  clindamycin (CLEOCIN) 300 MG capsule Take 1 capsule (300 mg total) by mouth 3 (three) times daily for 10 days. 06/20/19 06/30/19  Curatolo, Adam, DO  ibuprofen (ADVIL,MOTRIN) 800 MG tablet Take 1 tablet (800 mg total) by mouth 3 (three) times daily. 02/13/18   Charlesetta Shanks, MD  ondansetron (ZOFRAN) 4 MG tablet Take 1 tablet (4 mg total) by mouth every 8 (eight) hours as needed. 04/09/17   Tegeler, Gwenyth Allegra, MD  sulfamethoxazole-trimethoprim (BACTRIM DS) 800-160 MG tablet Take 1 tablet by mouth 2 (two) times daily for 5 days. 06/23/19 06/28/19  Tacy Learn, PA-C    Family History Family History  Problem Relation Age of Onset  . COPD Mother   . Hypertension Father     Social History Social History   Tobacco Use  . Smoking status: Former Smoker   Packs/day: 0.15    Types: Cigarettes  . Smokeless tobacco: Never Used  . Tobacco comment: reports that she stopped June 2020  Substance Use Topics  . Alcohol use: Yes    Comment: every weekend  . Drug use: No     Allergies   Patient has no known allergies.   Review of Systems Review of Systems  Constitutional: Negative for fever.  Gastrointestinal: Negative for abdominal pain, constipation, diarrhea, nausea and vomiting.  Genitourinary: Positive for dysuria, frequency, urgency and vaginal discharge.  Musculoskeletal: Negative for arthralgias and myalgias.  Skin: Negative for color change, rash and wound.  Allergic/Immunologic: Negative for immunocompromised state.  All other systems reviewed and are negative.    Physical Exam Updated Vital Signs BP (!) 111/56 (BP Location: Left Arm)   Pulse 72   Temp 98.5 F (36.9 C) (Oral)   Resp 18   Ht 5\' 4"  (1.626 m)   Wt 105.7 kg   LMP 05/26/2019   SpO2 96%   BMI 39.99 kg/m   Physical Exam Vitals signs and nursing note reviewed. Exam conducted with a chaperone present.  Constitutional:      General: She is not in acute distress.    Appearance: She is well-developed. She is not diaphoretic.  HENT:     Head: Normocephalic and atraumatic.  Pulmonary:     Effort: Pulmonary effort is normal.  Genitourinary:  Vagina: Vaginal discharge present.     Cervix: No discharge or erythema.     Comments: Moderate amount of white discharge in the vaginal vault, likely Monistat cream. Skin:    General: Skin is warm and dry.     Findings: No erythema or rash.  Neurological:     Mental Status: She is alert and oriented to person, place, and time.  Psychiatric:        Behavior: Behavior normal.      ED Treatments / Results  Labs (all labs ordered are listed, but only abnormal results are displayed) Labs Reviewed  WET PREP, GENITAL - Abnormal; Notable for the following components:      Result Value   Clue Cells Wet Prep HPF POC  PRESENT (*)    WBC, Wet Prep HPF POC FEW (*)    All other components within normal limits  URINALYSIS, ROUTINE W REFLEX MICROSCOPIC - Abnormal; Notable for the following components:   Color, Urine ORANGE (*)    APPearance CLOUDY (*)    Glucose, UA 100 (*)    Bilirubin Urine SMALL (*)    Protein, ur 30 (*)    Nitrite POSITIVE (*)    All other components within normal limits  URINALYSIS, MICROSCOPIC (REFLEX) - Abnormal; Notable for the following components:   Bacteria, UA FEW (*)    All other components within normal limits  PREGNANCY, URINE  GC/CHLAMYDIA PROBE AMP ([place] NOT [facility]    EKG None  Radiology No results found.  Procedures Procedures (including critical care time)  Medications Ordered in ED Medications - No data to display   Initial Impression / Assessment and Plan / ED Course  I have reviewed the triage vital signs and the nursing notes.  Pertinent labs & imaging results that were available during my care of the patient were reviewed by me and considered in my medical decision making (see chart for details).  Clinical Course as of Jun 22 1520  Wed Jun 23, 2019  [id number] 28 year old female presents with complaint of vaginal discharge as well as dysuria.  Patient states she has taken AZO without relief of her dysuria.  Patient was seen in the ER 3 days ago and given clindamycin for a dental infection.  Patient denies concerns for STDs today but would like to be tested anyway.  On pelvic exam patient has a moderate amount of white discharge in the vaginal vault, likely Monistat which she applied earlier today.  Advised patient that her swabs may be inaccurate due to the Monistat and if she continues to have concerns for STDs she should follow-up with the health department for repeat testing.  Wet prep is positive for clue cells, patient is currently on clindamycin, advised her to continue the clindamycin which should cover her bacterial vaginosis.  Suspect UTI due to  dysuria with urine positive for nitrites and protein however appears to be contaminated sample.  Patient will be treated with Septra for her UTI.  Advised to follow-up with PCP.  Patient to call or check her MyChart for her gonorrhea chlamydia test in the next 3 to 5 days and present to health department or PCP for treatment if needed.   [LM]    Clinical Course User Index [LM] Jeannie FendMurphy, Dorlisa Savino A, PA-C      Final Clinical Impressions(s) / ED Diagnoses   Final diagnoses:  BV (bacterial vaginosis)  Urinary tract infection without hematuria, site unspecified    ED Discharge Orders  Ordered    sulfamethoxazole-trimethoprim (BACTRIM DS) 800-160 MG tablet  2 times daily     06/23/19 1458           Jeannie FendMurphy, Mathew Storck A, PA-C 06/23/19 1522    Terrilee FilesButler, Michael C, MD 06/24/19 1045

## 2019-06-24 LAB — GC/CHLAMYDIA PROBE AMP (~~LOC~~) NOT AT ARMC
Chlamydia: NEGATIVE
Neisseria Gonorrhea: NEGATIVE

## 2019-09-25 ENCOUNTER — Emergency Department (HOSPITAL_BASED_OUTPATIENT_CLINIC_OR_DEPARTMENT_OTHER)
Admission: EM | Admit: 2019-09-25 | Discharge: 2019-09-25 | Disposition: A | Discharge status: Home / Self Care | Payer: BC Managed Care – PPO | Attending: Emergency Medicine | Admitting: Emergency Medicine

## 2019-09-25 ENCOUNTER — Encounter (HOSPITAL_BASED_OUTPATIENT_CLINIC_OR_DEPARTMENT_OTHER): Payer: Self-pay | Admitting: Adult Health

## 2019-09-25 ENCOUNTER — Emergency Department (HOSPITAL_BASED_OUTPATIENT_CLINIC_OR_DEPARTMENT_OTHER): Payer: BC Managed Care – PPO

## 2019-09-25 ENCOUNTER — Other Ambulatory Visit: Payer: Self-pay

## 2019-09-25 DIAGNOSIS — S40022A Contusion of left upper arm, initial encounter: Secondary | ICD-10-CM | POA: Diagnosis not present

## 2019-09-25 DIAGNOSIS — M7918 Myalgia, other site: Secondary | ICD-10-CM | POA: Diagnosis not present

## 2019-09-25 DIAGNOSIS — Y92410 Unspecified street and highway as the place of occurrence of the external cause: Secondary | ICD-10-CM | POA: Diagnosis not present

## 2019-09-25 DIAGNOSIS — M545 Low back pain: Secondary | ICD-10-CM | POA: Insufficient documentation

## 2019-09-25 DIAGNOSIS — Y9389 Activity, other specified: Secondary | ICD-10-CM | POA: Insufficient documentation

## 2019-09-25 DIAGNOSIS — S4992XA Unspecified injury of left shoulder and upper arm, initial encounter: Secondary | ICD-10-CM | POA: Diagnosis present

## 2019-09-25 DIAGNOSIS — R0789 Other chest pain: Secondary | ICD-10-CM | POA: Insufficient documentation

## 2019-09-25 DIAGNOSIS — Z87891 Personal history of nicotine dependence: Secondary | ICD-10-CM | POA: Insufficient documentation

## 2019-09-25 DIAGNOSIS — M25552 Pain in left hip: Secondary | ICD-10-CM | POA: Insufficient documentation

## 2019-09-25 DIAGNOSIS — Y999 Unspecified external cause status: Secondary | ICD-10-CM | POA: Diagnosis not present

## 2019-09-25 LAB — HCG, SERUM, QUALITATIVE: Preg, Serum: NEGATIVE

## 2019-09-25 LAB — PREGNANCY, URINE: Preg Test, Ur: NEGATIVE

## 2019-09-25 MED ORDER — CYCLOBENZAPRINE HCL 10 MG PO TABS: 10.0000 mg | ORAL_TABLET | Freq: Two times a day (BID) | ORAL | 0 refills | Status: DC | PRN

## 2019-09-25 MED ORDER — HYDROCODONE-ACETAMINOPHEN 5-325 MG PO TABS
1.0000 | ORAL_TABLET | Freq: Once | ORAL | Status: AC
  Administered 2019-09-25: 1 via ORAL
  Filled 2019-09-25: qty 1

## 2019-09-25 NOTE — ED Notes (Addendum)
When pt was taken back to room she stated, "what kind of room is this. I feel like I am being discriminated against" Rn responded, "I am so sorry you feel that way"   She then stated, "You all are not that busy and the parking lot is empty. You should have a room for me"
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
## 2019-09-25 NOTE — ED Notes (Signed)
PT was outside in her car and RN had to walk to car to get her to her room.
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
## 2019-09-25 NOTE — ED Notes (Signed)
Patient refuses exit VS.

## 2019-11-03 ENCOUNTER — Emergency Department (HOSPITAL_BASED_OUTPATIENT_CLINIC_OR_DEPARTMENT_OTHER)
Admission: EM | Admit: 2019-11-03 | Discharge: 2019-11-03 | Disposition: A | Discharge status: Home / Self Care | Payer: BC Managed Care – PPO | Attending: Emergency Medicine | Admitting: Emergency Medicine

## 2019-11-03 ENCOUNTER — Other Ambulatory Visit: Payer: Self-pay

## 2019-11-03 ENCOUNTER — Encounter (HOSPITAL_BASED_OUTPATIENT_CLINIC_OR_DEPARTMENT_OTHER): Payer: Self-pay | Admitting: Emergency Medicine

## 2019-11-03 DIAGNOSIS — R300 Dysuria: Secondary | ICD-10-CM | POA: Diagnosis present

## 2019-11-03 DIAGNOSIS — N390 Urinary tract infection, site not specified: Secondary | ICD-10-CM | POA: Insufficient documentation

## 2019-11-03 DIAGNOSIS — Z87891 Personal history of nicotine dependence: Secondary | ICD-10-CM | POA: Diagnosis not present

## 2019-11-03 LAB — URINALYSIS, ROUTINE W REFLEX MICROSCOPIC

## 2019-11-03 LAB — PREGNANCY, URINE: Preg Test, Ur: NEGATIVE

## 2019-11-03 LAB — URINALYSIS, MICROSCOPIC (REFLEX)

## 2019-11-03 MED ORDER — SULFAMETHOXAZOLE-TRIMETHOPRIM 800-160 MG PO TABS
1.0000 | ORAL_TABLET | Freq: Once | ORAL | Status: AC
  Administered 2019-11-03: 07:00:00 1 via ORAL
  Filled 2019-11-03: qty 1

## 2019-11-03 MED ORDER — SULFAMETHOXAZOLE-TRIMETHOPRIM 800-160 MG PO TABS: 1.0000 | ORAL_TABLET | Freq: Two times a day (BID) | ORAL | 0 refills | Status: AC

## 2019-11-03 MED ORDER — PHENAZOPYRIDINE HCL 100 MG PO TABS
200.0000 mg | ORAL_TABLET | Freq: Once | ORAL | Status: AC
  Administered 2019-11-03: 200 mg via ORAL
  Filled 2019-11-03: qty 2

## 2019-11-03 MED ORDER — PHENAZOPYRIDINE HCL 200 MG PO TABS: 200.0000 mg | ORAL_TABLET | Freq: Three times a day (TID) | ORAL | 0 refills | Status: DC

## 2019-11-03 NOTE — ED Notes (Signed)
Pt reports taking Azo last night with minimal relief. Pt also states she has not been sexually active since last STD check.

## 2019-11-03 NOTE — ED Triage Notes (Signed)
Pt reports difficulty urinating with frequency since last night.

## 2019-11-03 NOTE — ED Provider Notes (Signed)
[facility]PT [facility] Emergency Department Provider Note MRN:  [id number]  Arrival date & time: 11/03/19     Chief Complaint   Dysuria   History of Present Illness   Angela [name] is a 28 y.o. year-old female with a history of bipolar disorder presenting to the ED with chief complaint of dysuria.  Pressure sensation with urination started this evening.  Denies significant abdominal pain, no burning with urination, no fever, no back pain, no vaginal bleeding, no vaginal discharge.  No concerns for STDs.  Symptoms mild, constant, no exacerbating or alleviating factors.  Review of Systems  A problem-focused ROS was performed. Positive for dysuria.  Patient denies fever.  Patient's Health History    Past Medical History:  Diagnosis Date  . Bipolar 1 disorder (HCC)   . Depression     History reviewed. No pertinent surgical history.  Family History  Problem Relation Age of Onset  . COPD Mother   . Hypertension Father     Social History   Socioeconomic History  . Marital status: Single    Spouse name: Not on file  . Number of children: Not on file  . Years of education: Not on file  . Highest education level: Not on file  Occupational History  . Not on file  Social Needs  . Financial resource strain: Not on file  . Food insecurity    Worry: Not on file    Inability: Not on file  . Transportation needs    Medical: Not on file    Non-medical: Not on file  Tobacco Use  . Smoking status: Former Smoker    Packs/day: 0.15    Types: Cigarettes  . Smokeless tobacco: Never Used  . Tobacco comment: reports that she stopped June 2020  Substance and Sexual Activity  . Alcohol use: Yes    Comment: every weekend  . Drug use: No  . Sexual activity: Yes    Birth control/protection: None  Lifestyle  . Physical activity    Days per week: Not on file    Minutes per session: Not on file  . Stress: Not on file  Relationships  . Social Musician  on phone: Not on file    Gets together: Not on file    Attends religious service: Not on file    Active member of club or organization: Not on file    Attends meetings of clubs or organizations: Not on file    Relationship status: Not on file  . Intimate partner violence    Fear of current or ex partner: Not on file    Emotionally abused: Not on file    Physically abused: Not on file    Forced sexual activity: Not on file  Other Topics Concern  . Not on file  Social History Narrative  . Not on file     Physical Exam  Vital Signs and Nursing Notes reviewed Vitals:   11/03/19 0543  BP: 130/77  Pulse: 83  Resp: 18  Temp: 98.1 F (36.7 C)  SpO2: 98%    CONSTITUTIONAL: Well-appearing, NAD NEURO:  Alert and oriented x 3, no focal deficits EYES:  eyes equal and reactive ENT/NECK:  no LAD, no JVD CARDIO: Regular rate, well-perfused, normal S1 and S2 PULM:  CTAB no wheezing or rhonchi GI/GU:  normal bowel sounds, non-distended, non-tender MSK/SPINE:  No gross deformities, no edema SKIN:  no rash, atraumatic PSYCH:  Appropriate speech and behavior  Diagnostic and Interventional  Summary    EKG Interpretation  Date/Time:    Ventricular Rate:    PR Interval:    QRS Duration:   QT Interval:    QTC Calculation:   R Axis:     Text Interpretation:        Labs Reviewed  URINALYSIS, ROUTINE W REFLEX MICROSCOPIC - Abnormal; Notable for the following components:      Result Value   Color, Urine ORANGE (*)    APPearance CLOUDY (*)    Glucose, UA   (*)    Value: TEST NOT REPORTED DUE TO COLOR INTERFERENCE OF URINE PIGMENT   Hgb urine dipstick   (*)    Value: TEST NOT REPORTED DUE TO COLOR INTERFERENCE OF URINE PIGMENT   Bilirubin Urine   (*)    Value: TEST NOT REPORTED DUE TO COLOR INTERFERENCE OF URINE PIGMENT   Ketones, ur   (*)    Value: TEST NOT REPORTED DUE TO COLOR INTERFERENCE OF URINE PIGMENT   Protein, ur   (*)    Value: TEST NOT REPORTED DUE TO COLOR  INTERFERENCE OF URINE PIGMENT   Nitrite   (*)    Value: TEST NOT REPORTED DUE TO COLOR INTERFERENCE OF URINE PIGMENT   Leukocytes,Ua   (*)    Value: TEST NOT REPORTED DUE TO COLOR INTERFERENCE OF URINE PIGMENT   All other components within normal limits  URINALYSIS, MICROSCOPIC (REFLEX) - Abnormal; Notable for the following components:   Bacteria, UA MANY (*)    All other components within normal limits  PREGNANCY, URINE    No orders to display    Medications - No data to display   Procedures  /  Critical Care Procedures  ED Course and Medical Decision Making  I have reviewed the triage vital signs and the nursing notes.  Pertinent labs & imaging results that were available during my care of the patient were reviewed by me and considered in my medical decision making (see below for details).     Suspect UTI, UA is pending.  Normal vital signs, no fever, benign abdomen, nothing to suggest systemic illness.  Monogamous relationship, no vaginal bleeding or discharge, nothing to suggest PID.  Urinalysis showing some evidence to suggest infection, will cover with Bactrim, appropriate for discharge.  Barth Kirks. Sedonia Small, [place] [email or web address]  Final Clinical Impressions(s) / ED Diagnoses     ICD-10-CM   1. Lower urinary tract infectious disease  N39.0     ED Discharge Orders         Ordered    sulfamethoxazole-trimethoprim (BACTRIM DS) 800-160 MG tablet  2 times daily     11/03/19 0611           Discharge Instructions Discussed with and Provided to Patient:     Discharge Instructions     You were evaluated in the Emergency Department and after careful evaluation, we did not find any emergent condition requiring admission or further testing in the hospital.  Your exam/testing today was overall reassuring.  Your urine sample showed signs of infection.  Please take the antibiotics as directed.  Please return to  the Emergency Department if you experience any worsening of your condition.  We encourage you to follow up with a primary care provider.  Thank you for allowing [place] to be a part of your care.        Maudie Flakes, MD 11/03/19 [phone]

## 2019-11-03 NOTE — Discharge Instructions (Addendum)
You were evaluated in the Emergency Department and after careful evaluation, we did not find any emergent condition requiring admission or further testing in the hospital.  Your exam/testing today was overall reassuring.  Your urine sample showed signs of infection.  Please take the antibiotics as directed.  Please return to the Emergency Department if you experience any worsening of your condition.  We encourage you to follow up with a primary care provider.  Thank you for allowing [place] to be a part of your care.

## 2019-11-20 ENCOUNTER — Encounter (HOSPITAL_BASED_OUTPATIENT_CLINIC_OR_DEPARTMENT_OTHER): Payer: Self-pay | Admitting: *Deleted

## 2019-11-20 ENCOUNTER — Other Ambulatory Visit: Payer: Self-pay

## 2019-11-20 ENCOUNTER — Emergency Department (HOSPITAL_BASED_OUTPATIENT_CLINIC_OR_DEPARTMENT_OTHER)
Admission: EM | Admit: 2019-11-20 | Discharge: 2019-11-20 | Disposition: A | Discharge status: Home / Self Care | Payer: BC Managed Care – PPO | Attending: Emergency Medicine | Admitting: Emergency Medicine

## 2019-11-20 DIAGNOSIS — Z20828 Contact with and (suspected) exposure to other viral communicable diseases: Secondary | ICD-10-CM | POA: Insufficient documentation

## 2019-11-20 DIAGNOSIS — Z87891 Personal history of nicotine dependence: Secondary | ICD-10-CM | POA: Insufficient documentation

## 2019-11-20 DIAGNOSIS — R5383 Other fatigue: Secondary | ICD-10-CM | POA: Insufficient documentation

## 2019-11-20 DIAGNOSIS — R509 Fever, unspecified: Secondary | ICD-10-CM | POA: Diagnosis not present

## 2019-11-20 DIAGNOSIS — R197 Diarrhea, unspecified: Secondary | ICD-10-CM | POA: Diagnosis not present

## 2019-11-20 DIAGNOSIS — R0602 Shortness of breath: Secondary | ICD-10-CM | POA: Insufficient documentation

## 2019-11-20 DIAGNOSIS — R6889 Other general symptoms and signs: Secondary | ICD-10-CM

## 2019-11-20 DIAGNOSIS — R05 Cough: Secondary | ICD-10-CM | POA: Diagnosis not present

## 2019-11-20 LAB — URINALYSIS, ROUTINE W REFLEX MICROSCOPIC
Bilirubin Urine: NEGATIVE
Glucose, UA: NEGATIVE mg/dL
Hgb urine dipstick: NEGATIVE
Ketones, ur: 15 mg/dL — AB
Leukocytes,Ua: NEGATIVE
Nitrite: NEGATIVE
Protein, ur: NEGATIVE mg/dL
Specific Gravity, Urine: >1.03 — ABNORMAL HIGH (ref 1.005–1.030)
pH: 6 (ref 5.0–8.0)

## 2019-11-20 LAB — PREGNANCY, URINE: Preg Test, Ur: NEGATIVE

## 2019-11-20 NOTE — ED Triage Notes (Signed)
Ptreports she was sent here by her employer to get a Covid test. She works for [employer]. States she feels SOB (O2 sats 99% on RA). Also reports her temp this morning was 102 and she has had diarrhea x 3 today

## 2019-11-20 NOTE — Discharge Instructions (Signed)
Your urinalysis here shows that you have a very mild case of dehydration.  You should stay home for the next couple of days drinking plenty of fluids.  Seek a medical exam for severe or worsening symptoms.  Your Covid test will not be back for the next 1 or 2 days, if it comes back positive you will need to quarantine at home for a full 10 days from the first day of your illness, please see the attached reading instructions.   Emergency department for severe difficulty breathing, chest pain, vomiting that is persistent or severe fevers that will not come down with Tylenol or ibuprofen.

## 2019-11-20 NOTE — ED Notes (Signed)
Pt verbalized understanding d/c and quarantine instructions 

## 2019-11-20 NOTE — ED Provider Notes (Signed)
[facility] EMERGENCY DEPARTMENT Provider Note   CSN: [id number] Arrival date & time: 11/20/19  1929     History Chief Complaint  Patient presents with  . Shortness of Breath    Needs Covid test    Angela [name] is a 28 y.o. female.  HPI     This patient is a 28 year old female with a known history of bipolar disorder and depression, she currently works at [employer] and reports that she was told to come here tonight for testing for coronavirus.  Her symptoms started approximately 1 week ago with fatigue and progressive generalized weakness, excessive sleepiness and last night started to have fevers as high as 102, she has had the occasional dry cough but is feeling short of breath, she has had diarrhea for 7 days.  She denies any bloody stools, denies any dysuria or urinary frequency.  She has felt excessively thirsty today.  The patient has had multiple sick contacts at work with coronavirus though she does not know who they are but gets emailed saying that people have been taken out because of Covid.  Past Medical History:  Diagnosis Date  . Bipolar 1 disorder ([place])   . Depression     There are no problems to display for this patient.   History reviewed. No pertinent surgical history.   OB History   No obstetric history on file.     Family History  Problem Relation Age of Onset  . COPD Mother   . Hypertension Father     Social History   Tobacco Use  . Smoking status: Former Smoker    Packs/day: 0.15    Types: Cigarettes  . Smokeless tobacco: Never Used  . Tobacco comment: reports that she stopped June 2020  Substance Use Topics  . Alcohol use: Yes    Comment: every weekend  . Drug use: No    Home Medications Prior to Admission medications   Not on File    Allergies    Patient has no known allergies.  Review of Systems   Review of Systems  All other systems reviewed and are negative.   Physical Exam Updated Vital Signs BP 117/78 (BP  Location: Left Arm)   Pulse 79   Temp 98.8 F (37.1 C) (Oral)   Resp 18   Ht 1.651 m (5\' 5" )   Wt 99.7 kg   LMP 11/04/2019   SpO2 99%   BMI 36.58 kg/m   Physical Exam Vitals and nursing note reviewed.  Constitutional:      General: She is not in acute distress.    Appearance: She is well-developed.  HENT:     Head: Normocephalic and atraumatic.     Mouth/Throat:     Pharynx: No oropharyngeal exudate.  Eyes:     General: No scleral icterus.       Right eye: No discharge.        Left eye: No discharge.     Conjunctiva/sclera: Conjunctivae normal.     Pupils: Pupils are equal, round, and reactive to light.  Neck:     Thyroid: No thyromegaly.     Vascular: No JVD.  Cardiovascular:     Rate and Rhythm: Normal rate and regular rhythm.     Heart sounds: Normal heart sounds. No murmur. No friction rub. No gallop.   Pulmonary:     Effort: Pulmonary effort is normal. No respiratory distress.     Breath sounds: Normal breath sounds. No wheezing or rales.  Abdominal:  General: Bowel sounds are normal. There is no distension.     Palpations: Abdomen is soft. There is no mass.     Tenderness: There is no abdominal tenderness.  Musculoskeletal:        General: No tenderness. Normal range of motion.     Cervical back: Normal range of motion and neck supple.  Lymphadenopathy:     Cervical: No cervical adenopathy.  Skin:    General: Skin is warm and dry.     Findings: No erythema or rash.  Neurological:     Mental Status: She is alert.     Coordination: Coordination normal.     Comments: Normal gait speech and coordination, normal strength in all 4 extremities, cranial nerves III through XII are normal, speech is normal, memory is intact  Psychiatric:        Behavior: Behavior normal.     ED Results / Procedures / Treatments   Labs (all labs ordered are listed, but only abnormal results are displayed) Labs Reviewed  URINALYSIS, ROUTINE W REFLEX MICROSCOPIC - Abnormal;  Notable for the following components:      Result Value   APPearance CLOUDY (*)    Specific Gravity, Urine >1.030 (*)    Ketones, ur 15 (*)    All other components within normal limits  URINE CULTURE  SARS CORONAVIRUS 2 (TAT 6-24 HRS)  PREGNANCY, URINE    EKG None  Radiology No results found.  Procedures Procedures (including critical care time)  Medications Ordered in ED Medications - No data to display  ED Course  I have reviewed the triage vital signs and the nursing notes.  Pertinent labs & imaging results that were available during my care of the patient were reviewed by me and considered in my medical decision making (see chart for details).  Clinical Course as of Nov 19 2204  Sat Nov 20, 2019  2203 Urinalysis negative except for 15 ketones, pregnancy negative, Covid test is pending, the patient is stable for discharge encouraged to stay out of work and drink plenty of fluids, she is agreeable to the plan   [BM]    Clinical Course User Index [BM] Eber Hong, MD   MDM Rules/Calculators/A&P                     The posterior pharynx is clear, there is no significant redness, exudate or swelling, the lungs are clear, abdomen is soft, there is no edema or rashes.  The patient has a normal neurologic exam, she is comfortable appearing, she does appear slightly fatigued but has totally normal vital signs and her level of alertness is normal.  She will be tested for Covid, check a urinalysis, as the patient did have a urinalysis showing many bacteria 2 weeks ago when she was tested.  We will make sure there is no infection, culture will be sent and pregnancy will be tested, the patient is agreeable to the plan.   Final Clinical Impression(s) / ED Diagnoses Final diagnoses:  Flu-like symptoms    Rx / DC Orders ED Discharge Orders    None       Eber Hong, MD 11/20/19 2205

## 2019-11-21 LAB — SARS CORONAVIRUS 2 (TAT 6-24 HRS): SARS Coronavirus 2: NEGATIVE

## 2019-11-23 LAB — URINE CULTURE: Culture: 20000 — AB

## 2019-11-24 ENCOUNTER — Telehealth: Payer: Self-pay

## 2019-11-24 NOTE — Telephone Encounter (Signed)
Post ED Visit - Positive Culture Follow-up  Culture report reviewed by antimicrobial stewardship pharmacist: [place] Team []  Elenor Quinones, Pharm.D. []  Heide Guile, Pharm.D., BCPS AQ-ID []  Parks Neptune, Pharm.D., BCPS []  Alycia Rossetti, Pharm.D., BCPS []  [place], Pharm.D., BCPS, AAHIVP []  Legrand Como, Pharm.D., BCPS, AAHIVP []  Salome Arnt, PharmD, BCPS []  Johnnette Gourd, PharmD, BCPS []  Hughes Better, PharmD, BCPS []  Leeroy Cha, PharmD []  Laqueta Linden, PharmD, BCPS []  Albertina Parr, PharmD [place] Team []  Leodis Sias, PharmD []  Lindell Spar, PharmD []  Royetta Asal, PharmD []  Graylin Shiver, Rph []  Rema Fendt) Glennon Mac, PharmD []  Arlyn Dunning, PharmD []  Netta Cedars, PharmD []  Dia Sitter, PharmD []  Leone Haven, PharmD []  Gretta Arab, PharmD []  Theodis Shove, PharmD []  Peggyann Juba, PharmD []  Reuel Boom, PharmD   Positive urine culture  and no further patient follow-up is required at this time.  [place], Yin 11/24/2019, 9:24 AM

## 2019-12-29 ENCOUNTER — Emergency Department (HOSPITAL_BASED_OUTPATIENT_CLINIC_OR_DEPARTMENT_OTHER)
Admission: EM | Admit: 2019-12-29 | Discharge: 2019-12-29 | Disposition: A | Discharge status: Home / Self Care | Payer: BC Managed Care – PPO | Attending: Emergency Medicine | Admitting: Emergency Medicine

## 2019-12-29 ENCOUNTER — Encounter (HOSPITAL_BASED_OUTPATIENT_CLINIC_OR_DEPARTMENT_OTHER): Payer: Self-pay | Admitting: Emergency Medicine

## 2019-12-29 ENCOUNTER — Other Ambulatory Visit: Payer: Self-pay

## 2019-12-29 DIAGNOSIS — N926 Irregular menstruation, unspecified: Secondary | ICD-10-CM | POA: Insufficient documentation

## 2019-12-29 DIAGNOSIS — Z20822 Contact with and (suspected) exposure to covid-19: Secondary | ICD-10-CM | POA: Insufficient documentation

## 2019-12-29 DIAGNOSIS — B349 Viral infection, unspecified: Secondary | ICD-10-CM | POA: Insufficient documentation

## 2019-12-29 DIAGNOSIS — Z87891 Personal history of nicotine dependence: Secondary | ICD-10-CM | POA: Insufficient documentation

## 2019-12-29 LAB — SARS CORONAVIRUS 2 (TAT 6-24 HRS): SARS Coronavirus 2: NEGATIVE

## 2019-12-29 LAB — PREGNANCY, URINE: Preg Test, Ur: NEGATIVE

## 2019-12-29 LAB — SARS CORONAVIRUS 2 AG (30 MIN TAT): SARS Coronavirus 2 Ag: NEGATIVE

## 2019-12-29 MED ORDER — ONDANSETRON 8 MG PO TBDP: 8.0000 mg | ORAL_TABLET | Freq: Three times a day (TID) | ORAL | 0 refills | Status: DC | PRN

## 2019-12-29 NOTE — ED Provider Notes (Signed)
[facility] Provider Note: Angela Spurling, MD, FACEP  CSN: [id number] MRN: [id number] ARRIVAL: 12/29/19 at [facility]: [place]  Fever and Diarrhea   HISTORY OF PRESENT ILLNESS  12/29/19 12:48 AM Angela [name] is a 29 y.o. female who had 2 episodes of vomiting 2 days ago.  Yesterday she awoke with a fever of 104 and had 6 episodes of diarrhea which was nonbloody.  She is having body aches and some minimal nasal congestion.  She denies shortness of breath, abdominal pain and dysuria.  She states her period is 5 days late and she is not getting the cramping and headache she usually gets with her period.  She denies pain at the present time.  She is here requesting a Covid test.   Past Medical History:  Diagnosis Date  . Bipolar 1 disorder ([place])   . Depression     History reviewed. No pertinent surgical history.  Family History  Problem Relation Age of Onset  . COPD Mother   . Hypertension Father     Social History   Tobacco Use  . Smoking status: Former Smoker    Packs/day: 0.15    Types: Cigarettes  . Smokeless tobacco: Never Used  . Tobacco comment: reports that she stopped June 2020  Substance Use Topics  . Alcohol use: Yes    Comment: every weekend  . Drug use: No    Prior to Admission medications   Medication Sig Start Date End Date Taking? Authorizing Provider  ondansetron (ZOFRAN ODT) 8 MG disintegrating tablet Take 1 tablet (8 mg total) by mouth every 8 (eight) hours as needed for nausea or vomiting. 12/29/19   , Jenny Reichmann, MD    Allergies Patient has no known allergies.   REVIEW OF SYSTEMS  Negative except as noted here or in the History of Present Illness.   PHYSICAL EXAMINATION  Initial Vital Signs Blood pressure 123/75, pulse 88, temperature 98.5 F (36.9 C), temperature source Oral, resp. rate 20, height 5' 4" (1.626 m), weight 95.3 kg, last menstrual period 11/28/2019, SpO2 100 %.  Examination General:  Well-developed, well-nourished female in no acute distress; appearance consistent with age of record HENT: normocephalic; atraumatic Eyes: pupils equal, round and reactive to light; extraocular muscles intact Neck: supple Heart: regular rate and rhythm Lungs: clear to auscultation bilaterally Abdomen: soft; nondistended; nontender; bowel sounds present Extremities: No deformity; full range of motion; pulses normal Neurologic: Awake, alert and oriented; motor function intact in all extremities and symmetric; no facial droop Skin: Warm and dry Psychiatric: Normal mood and affect   RESULTS  Summary of this visit's results, reviewed and interpreted by myself:   EKG Interpretation  Date/Time:    Ventricular Rate:    PR Interval:    QRS Duration:   QT Interval:    QTC Calculation:   R Axis:     Text Interpretation:        Laboratory Studies: Results for orders placed or performed during the hospital encounter of 12/29/19 (from the past 24 hour(s))  Pregnancy, urine     Status: None   Collection Time: 12/29/19 12:43 AM  Result Value Ref Range   Preg Test, Ur NEGATIVE NEGATIVE  SARS Coronavirus 2 Ag (30 min TAT) - Nasal Swab (BD Veritor Kit)     Status: None   Collection Time: 12/29/19  1:02 AM   Specimen: Nasal Swab (BD Veritor Kit)  Result Value Ref Range   SARS Coronavirus 2 Ag NEGATIVE NEGATIVE  Imaging Studies: No results found.  ED COURSE and MDM  Nursing notes, initial and subsequent vitals signs, including pulse oximetry, reviewed and interpreted by myself.  Vitals:   12/29/19 0038 12/29/19 0039  BP: 123/75   Pulse: 88   Resp: 20   Temp: 98.5 F (36.9 C)   TempSrc: Oral   SpO2: 100%   Weight:  95.3 kg  Height:  5' 4" (1.626 m)   Medications - No data to display  Angela [name] was evaluated in Emergency Department on 12/29/2019 for the symptoms described in the history of present illness. She was evaluated in the context of the global COVID-19 pandemic,  which necessitated consideration that the patient might be at risk for infection with the SARS-CoV-2 virus that causes COVID-19. Institutional protocols and algorithms that pertain to the evaluation of patients at risk for COVID-19 are in a state of rapid change based on information released by regulatory bodies including the CDC and federal and state organizations. These policies and algorithms were followed during the patient's care in the ED.  Patient's COVID-19 antigen test negative, confirmatory PCR test sent.  Patient advised to take over-the-counter antidiarrheals as needed for diarrhea.  We will provide antiemetic as needed.  PROCEDURES  Procedures   ED DIAGNOSES     ICD-10-CM   1. Viral illness  B34.9   2. Late menses  N92.6        Bradshaw Minihan, MD 12/29/19 [phone]

## 2019-12-29 NOTE — ED Triage Notes (Signed)
States had fever of 104 this am. Angela [name] to work and started to feel bad at 2100 yesterday. No fever in triage. Also c/o diarrhea yesterday 6 times. Also c/o headache and body aches.

## 2020-04-14 DIAGNOSIS — N921 Excessive and frequent menstruation with irregular cycle: Secondary | ICD-10-CM | POA: Insufficient documentation

## 2020-04-14 DIAGNOSIS — R102 Pelvic and perineal pain: Secondary | ICD-10-CM | POA: Insufficient documentation

## 2020-05-18 DIAGNOSIS — F411 Generalized anxiety disorder: Secondary | ICD-10-CM

## 2020-05-18 HISTORY — DX: Generalized anxiety disorder: F41.1

## 2021-03-08 ENCOUNTER — Emergency Department (HOSPITAL_BASED_OUTPATIENT_CLINIC_OR_DEPARTMENT_OTHER)
Admission: EM | Admit: 2021-03-08 | Discharge: 2021-03-09 | Disposition: A | Discharge status: Home / Self Care | Payer: BC Managed Care – PPO | Attending: Emergency Medicine | Admitting: Emergency Medicine

## 2021-03-08 ENCOUNTER — Other Ambulatory Visit: Payer: Self-pay

## 2021-03-08 ENCOUNTER — Encounter (HOSPITAL_BASED_OUTPATIENT_CLINIC_OR_DEPARTMENT_OTHER): Payer: Self-pay | Admitting: Emergency Medicine

## 2021-03-08 DIAGNOSIS — N760 Acute vaginitis: Secondary | ICD-10-CM | POA: Insufficient documentation

## 2021-03-08 DIAGNOSIS — R300 Dysuria: Secondary | ICD-10-CM

## 2021-03-08 DIAGNOSIS — B9689 Other specified bacterial agents as the cause of diseases classified elsewhere: Secondary | ICD-10-CM | POA: Insufficient documentation

## 2021-03-08 DIAGNOSIS — Z87891 Personal history of nicotine dependence: Secondary | ICD-10-CM | POA: Insufficient documentation

## 2021-03-08 LAB — WET PREP, GENITAL
Sperm: NONE SEEN
Trich, Wet Prep: NONE SEEN
Yeast Wet Prep HPF POC: NONE SEEN

## 2021-03-08 LAB — URINALYSIS, MICROSCOPIC (REFLEX)

## 2021-03-08 LAB — URINALYSIS, ROUTINE W REFLEX MICROSCOPIC
Bilirubin Urine: NEGATIVE
Glucose, UA: NEGATIVE mg/dL
Hgb urine dipstick: NEGATIVE
Ketones, ur: NEGATIVE mg/dL
Nitrite: NEGATIVE
Protein, ur: NEGATIVE mg/dL
Specific Gravity, Urine: 1.02 (ref 1.005–1.030)
pH: 8 (ref 5.0–8.0)

## 2021-03-08 LAB — PREGNANCY, URINE: Preg Test, Ur: NEGATIVE

## 2021-03-08 MED ORDER — METRONIDAZOLE 500 MG PO TABS: 500.0000 mg | ORAL_TABLET | Freq: Two times a day (BID) | ORAL | 0 refills | Status: DC

## 2021-03-08 MED ORDER — FOSFOMYCIN TROMETHAMINE 3 G PO PACK
3.0000 g | PACK | Freq: Once | ORAL | Status: AC
  Administered 2021-03-08: 3 g via ORAL
  Filled 2021-03-08: qty 3

## 2021-03-08 NOTE — ED Triage Notes (Signed)
Pt wants to be checked for a STD  Pt states she has a thick discharge that started at the beginning of the week  Pt also c/o dysuria

## 2021-03-08 NOTE — ED Provider Notes (Signed)
[facility] Provider Note: [name], MD, FACEP  CSN: [id number] MRN: [id number] ARRIVAL: 03/08/21 at 2157 ROOM: MH08/MH08   CHIEF COMPLAINT  Vaginal Discharge   HISTORY OF PRESENT ILLNESS  03/08/21 11:03 PM Angela [name] is a 30 y.o. female with several days of vulvovaginal discomfort which she rates as a 5 out of 10.  She describes it is burning in nature.  It is worse with urination.  She also has a white discharge.  She has had some spotting recently as well and thinks she may be beginning her period.   Past Medical History:  Diagnosis Date  . Bipolar 1 disorder ([facility])   . Depression     History reviewed. No pertinent surgical history.  Family History  Problem Relation Age of Onset  . COPD Mother   . Hypertension Father     Social History   Tobacco Use  . Smoking status: Former Smoker    Packs/day: 0.15    Types: Cigarettes  . Smokeless tobacco: Never Used  . Tobacco comment: reports that she stopped June 2020  Vaping Use  . Vaping Use: Never used  Substance Use Topics  . Alcohol use: Yes    Comment: every weekend  . Drug use: No    Prior to Admission medications   Medication Sig Start Date End Date Taking? Authorizing Provider  metroNIDAZOLE (FLAGYL) 500 MG tablet Take 1 tablet (500 mg total) by mouth 2 (two) times daily. One po bid x 7 days 03/08/21  Yes Tiffany Calmes, MD    Allergies Patient has no known allergies.   REVIEW OF SYSTEMS  Negative except as noted here or in the History of Present Illness.   PHYSICAL EXAMINATION  Initial Vital Signs Blood pressure 128/84, pulse 87, temperature 98.5 F (36.9 C), temperature source Oral, resp. rate 18, height 5\' 5"  (1.651 m), weight 105.2 kg, last menstrual period 02/10/2021, SpO2 95 %.  Examination General: Well-developed, well-nourished female in no acute distress; appearance consistent with age of record HENT: normocephalic; atraumatic Eyes: Normal appearance Neck: supple Heart:  regular rate and rhythm Lungs: clear to auscultation bilaterally Abdomen: soft; nondistended; nontender; bowel sounds present GU: Normal external genitalia; no vaginal bleeding; white vaginal discharge; no cervical motion tenderness; no adnexal tenderness; no vulvovaginal inflammation; bladder tender Extremities: No deformity; full range of motion; pulses normal Neurologic: Awake, alert and oriented; motor function intact in all extremities and symmetric; no facial droop Skin: Warm and dry Psychiatric: Normal mood and affect   RESULTS  Summary of this visit's results, reviewed and interpreted by myself:   EKG Interpretation  Date/Time:    Ventricular Rate:    PR Interval:    QRS Duration:   QT Interval:    QTC Calculation:   R Axis:     Text Interpretation:        Laboratory Studies: Results for orders placed or performed during the hospital encounter of 03/08/21 (from the past 24 hour(s))  Pregnancy, urine     Status: None   Collection Time: 03/08/21 10:05 PM  Result Value Ref Range   Preg Test, Ur NEGATIVE NEGATIVE  Urinalysis, Routine w reflex microscopic     Status: Abnormal   Collection Time: 03/08/21 10:05 PM  Result Value Ref Range   Color, Urine YELLOW YELLOW   APPearance CLOUDY (A) CLEAR   Specific Gravity, Urine 1.020 1.005 - 1.030   pH 8.0 5.0 - 8.0   Glucose, UA NEGATIVE NEGATIVE mg/dL   Hgb urine dipstick  NEGATIVE NEGATIVE   Bilirubin Urine NEGATIVE NEGATIVE   Ketones, ur NEGATIVE NEGATIVE mg/dL   Protein, ur NEGATIVE NEGATIVE mg/dL   Nitrite NEGATIVE NEGATIVE   Leukocytes,Ua TRACE (A) NEGATIVE  Urinalysis, Microscopic (reflex)     Status: Abnormal   Collection Time: 03/08/21 10:05 PM  Result Value Ref Range   RBC / HPF 0-5 0 - 5 RBC/hpf   WBC, UA 6-10 0 - 5 WBC/hpf   Bacteria, UA FEW (A) NONE SEEN   Squamous Epithelial / LPF 6-10 0 - 5   Mucus PRESENT    Amorphous Crystal PRESENT   Wet prep, genital     Status: Abnormal   Collection Time: 03/08/21  11:10 PM   Specimen: Vaginal; Genital  Result Value Ref Range   Yeast Wet Prep HPF POC NONE SEEN NONE SEEN   Trich, Wet Prep NONE SEEN NONE SEEN   Clue Cells Wet Prep HPF POC PRESENT (A) NONE SEEN   WBC, Wet Prep HPF POC MANY (A) NONE SEEN   Sperm NONE SEEN    Imaging Studies: No results found.  ED COURSE and MDM  Nursing notes, initial and subsequent vitals signs, including pulse oximetry, reviewed and interpreted by myself.  Vitals:   03/08/21 2202 03/08/21 2202  BP:  128/84  Pulse:  87  Resp:  18  Temp:  98.5 F (36.9 C)  TempSrc:  Oral  SpO2:  95%  Weight: 105.2 kg   Height: 5\' 5"  (1.651 m)    Medications  fosfomycin (MONUROL) packet 3 g (has no administration in time range)    Wet prep is consistent with bacterial vaginosis.  Her dysuria, bladder tenderness suggest a UTI as well although her urinalysis is equivocal we will go ahead and treat with a dose of fosfomycin.  PROCEDURES  Procedures   ED DIAGNOSES     ICD-10-CM   1. Bacterial vaginosis  N76.0    B96.89   2. Dysuria  R30.0        Keyosha Tiedt, , MD 03/08/21 [phone]

## 2021-03-09 LAB — GC/CHLAMYDIA PROBE AMP (~~LOC~~) NOT AT ARMC
Chlamydia: NEGATIVE
Comment: NEGATIVE
Comment: NORMAL
Neisseria Gonorrhea: NEGATIVE

## 2021-04-10 ENCOUNTER — Other Ambulatory Visit: Payer: Self-pay

## 2021-04-10 ENCOUNTER — Emergency Department (HOSPITAL_BASED_OUTPATIENT_CLINIC_OR_DEPARTMENT_OTHER)
Admission: EM | Admit: 2021-04-10 | Discharge: 2021-04-10 | Disposition: A | Discharge status: Home / Self Care | Payer: Self-pay | Attending: Emergency Medicine | Admitting: Emergency Medicine

## 2021-04-10 ENCOUNTER — Encounter (HOSPITAL_BASED_OUTPATIENT_CLINIC_OR_DEPARTMENT_OTHER): Payer: Self-pay | Admitting: *Deleted

## 2021-04-10 DIAGNOSIS — N760 Acute vaginitis: Secondary | ICD-10-CM | POA: Insufficient documentation

## 2021-04-10 DIAGNOSIS — Z87891 Personal history of nicotine dependence: Secondary | ICD-10-CM | POA: Insufficient documentation

## 2021-04-10 DIAGNOSIS — B9689 Other specified bacterial agents as the cause of diseases classified elsewhere: Secondary | ICD-10-CM | POA: Insufficient documentation

## 2021-04-10 LAB — URINALYSIS, ROUTINE W REFLEX MICROSCOPIC
Bilirubin Urine: NEGATIVE
Glucose, UA: NEGATIVE mg/dL
Hgb urine dipstick: NEGATIVE
Ketones, ur: NEGATIVE mg/dL
Leukocytes,Ua: NEGATIVE
Nitrite: NEGATIVE
Protein, ur: NEGATIVE mg/dL
Specific Gravity, Urine: >1.03 — ABNORMAL HIGH (ref 1.005–1.030)
pH: 5.5 (ref 5.0–8.0)

## 2021-04-10 LAB — WET PREP, GENITAL
Sperm: NONE SEEN
Trich, Wet Prep: NONE SEEN
Yeast Wet Prep HPF POC: NONE SEEN

## 2021-04-10 LAB — PREGNANCY, URINE: Preg Test, Ur: NEGATIVE

## 2021-04-10 LAB — CBG MONITORING, ED: Glucose-Capillary: 101 mg/dL — ABNORMAL HIGH (ref 70–99)

## 2021-04-10 MED ORDER — METRONIDAZOLE 500 MG PO TABS: 500.0000 mg | ORAL_TABLET | Freq: Two times a day (BID) | ORAL | 0 refills | Status: DC

## 2021-04-10 MED ORDER — METRONIDAZOLE 500 MG PO TABS
500.0000 mg | ORAL_TABLET | Freq: Once | ORAL | Status: AC
  Administered 2021-04-10: 500 mg via ORAL
  Filled 2021-04-10: qty 1

## 2021-04-10 NOTE — ED Provider Notes (Signed)
[facility] EMERGENCY DEPARTMENT Provider Note   CSN: [id number] Arrival date & time: 04/10/21  1427     History Chief Complaint  Patient presents with  . Vaginal Discharge    Angela [name] is a 30 y.o. female with a past medical history significant for bipolar 1 disorder and depression who presents to the ED due to increased vaginal discharge and dysuria x1 day.  Patient states her vaginal discharge is off-white in color.  Denies malodor.  Last menstrual cycle beginning of last month.  She is currently sexually active with 1 partner without protection.  Denies associated abdominal pain. Denies fever and chills. Admits to have a history of past STIs. No treatment prior to arrival. No aggravating or alleviating factors.   History obtained from patient and past medical records. No interpreter used during encounter.       Past Medical History:  Diagnosis Date  . Bipolar 1 disorder ([facility])   . Depression     There are no problems to display for this patient.   History reviewed. No pertinent surgical history.   OB History   No obstetric history on file.     Family History  Problem Relation Age of Onset  . COPD Mother   . Hypertension Father     Social History   Tobacco Use  . Smoking status: Former Smoker    Packs/day: 0.15    Types: Cigarettes  . Smokeless tobacco: Never Used  . Tobacco comment: reports that she stopped June 2020  Vaping Use  . Vaping Use: Never used  Substance Use Topics  . Alcohol use: Yes    Comment: every weekend  . Drug use: No    Home Medications Prior to Admission medications   Medication Sig Start Date End Date Taking? Authorizing Provider  metroNIDAZOLE (FLAGYL) 500 MG tablet Take 1 tablet (500 mg total) by mouth 2 (two) times daily. 04/10/21  Yes Polo Mcmartin, Merla Riches, PA-C  metroNIDAZOLE (FLAGYL) 500 MG tablet Take 1 tablet (500 mg total) by mouth 2 (two) times daily. One po bid x 7 days 03/08/21   Molpus, John, MD     Allergies    Patient has no known allergies.  Review of Systems   Review of Systems  Constitutional: Negative for chills and fever.  Gastrointestinal: Negative for abdominal pain, diarrhea, nausea and vomiting.  Genitourinary: Positive for dysuria and vaginal discharge. Negative for pelvic pain.  All other systems reviewed and are negative.   Physical Exam Updated Vital Signs BP 122/90 (BP Location: Right Arm)   Pulse 76   Temp 98.4 F (36.9 C)   Resp 16   Ht 5\' 5"  (1.651 m)   Wt 104.3 kg   SpO2 93%   BMI 38.27 kg/m   Physical Exam Vitals and nursing note reviewed.  Constitutional:      General: She is not in acute distress.    Appearance: She is not ill-appearing.  HENT:     Head: Normocephalic.  Eyes:     Pupils: Pupils are equal, round, and reactive to light.  Cardiovascular:     Rate and Rhythm: Normal rate and regular rhythm.     Pulses: Normal pulses.     Heart sounds: Normal heart sounds. No murmur heard. No friction rub. No gallop.   Pulmonary:     Effort: Pulmonary effort is normal.     Breath sounds: Normal breath sounds.  Abdominal:     General: Abdomen is flat. There is no distension.  Palpations: Abdomen is soft.     Tenderness: There is no abdominal tenderness. There is no guarding or rebound.  Genitourinary:    Comments: Pelvic exam performed with chaperone in room.  Mild amount of white discharge.  No CMT.  No adnexal masses or tenderness.   Musculoskeletal:        General: Normal range of motion.     Cervical back: Neck supple.  Skin:    General: Skin is warm and dry.  Neurological:     General: No focal deficit present.     Mental Status: She is alert.  Psychiatric:        Mood and Affect: Mood normal.        Behavior: Behavior normal.     ED Results / Procedures / Treatments   Labs (all labs ordered are listed, but only abnormal results are displayed) Labs Reviewed  WET PREP, GENITAL - Abnormal; Notable for the following  components:      Result Value   Clue Cells Wet Prep HPF POC PRESENT (*)    WBC, Wet Prep HPF POC MANY (*)    All other components within normal limits  URINALYSIS, ROUTINE W REFLEX MICROSCOPIC - Abnormal; Notable for the following components:   APPearance HAZY (*)    Specific Gravity, Urine >1.030 (*)    All other components within normal limits  CBG MONITORING, ED - Abnormal; Notable for the following components:   Glucose-Capillary 101 (*)    All other components within normal limits  PREGNANCY, URINE  GC/CHLAMYDIA PROBE AMP ([place]) NOT AT [facility]    EKG None  Radiology No results found.  Procedures Procedures   Medications Ordered in ED Medications  metroNIDAZOLE (FLAGYL) tablet 500 mg (500 mg Oral Given 04/10/21 1754)    ED Course  I have reviewed the triage vital signs and the nursing notes.  Pertinent labs & imaging results that were available during my care of the patient were reviewed by me and considered in my medical decision making (see chart for details).  Clinical Course as of 04/10/21 1813  Tue Apr 10, 2021  1740 Clue Cells Wet Prep HPF POC(!): PRESENT [CA]  1742 Glucose-Capillary(!): 101 [CA]    Clinical Course User Index [CA] Jesusita Oka   MDM Rules/Calculators/A&P                         30 year old female presents to the ED due to vaginal discharge and dysuria x1 day.  Patient is currently sexually active with 1 partner without protection.  Denies abdominal pain, fever, and chills.  Upon arrival, vitals all within normal limits.  Patient in no acute distress and nontoxic-appearing.  Physical exam reassuring.  Abdomen soft, nondistended, nontender. Doubt acute abdomen. Pelvic exam performed with chaperone in room.  Mild amount of white discharge.  No CMT.  No adnexal masses or tenderness.  Doubt PID.  UA negative for signs of infection.  Pregnancy test negative.  Wet prep positive for clue cells and many white blood cells.  Patient  treated for bacterial vaginosis with Flagyl.  Patient discharged with prescription for Flagyl.  Instructed patient not to drink alcohol while on the medication.  Advised patient follow-up with OB/GYN if symptoms not improved within the next week. Strict ED precautions discussed with patient. Patient states understanding and agrees to plan. Patient discharged home in no acute distress and stable vitals.  Final Clinical Impression(s) / ED Diagnoses Final diagnoses:  BV (bacterial vaginosis)    Rx / DC Orders ED Discharge Orders         Ordered    metroNIDAZOLE (FLAGYL) 500 MG tablet  2 times daily        04/10/21 1743           Jesusita Oka 04/10/21 1813    Tegeler, Canary Brim, MD 04/10/21 [phone]

## 2021-04-10 NOTE — Discharge Instructions (Addendum)
As discussed, your wet prep was positive for bacterial vaginosis.  I am sending you home with antibiotics.  Take as prescribed and finish all antibiotics.  Do not drink alcohol while you are on the antibiotics.  If symptoms not improve within the next week, please follow-up with OB/GYN for further evaluation.  Your gonorrhea and Chlamydia test are pending. You will be called if those results are positive. Continue to use protection with intercourse. Return to the ER for new or worsening symptoms.

## 2021-04-10 NOTE — ED Triage Notes (Addendum)
Vaginal discharge. Urinary frequency.

## 2021-04-11 LAB — GC/CHLAMYDIA PROBE AMP (~~LOC~~) NOT AT ARMC
Chlamydia: NEGATIVE
Comment: NEGATIVE
Comment: NORMAL
Neisseria Gonorrhea: NEGATIVE
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
## 2021-07-05 ENCOUNTER — Other Ambulatory Visit: Payer: Self-pay

## 2021-07-05 ENCOUNTER — Other Ambulatory Visit (HOSPITAL_BASED_OUTPATIENT_CLINIC_OR_DEPARTMENT_OTHER): Payer: Self-pay

## 2021-07-05 ENCOUNTER — Emergency Department (HOSPITAL_BASED_OUTPATIENT_CLINIC_OR_DEPARTMENT_OTHER)
Admission: EM | Admit: 2021-07-05 | Discharge: 2021-07-05 | Disposition: A | Discharge status: Home / Self Care | Payer: Self-pay | Attending: Emergency Medicine | Admitting: Emergency Medicine

## 2021-07-05 ENCOUNTER — Encounter (HOSPITAL_BASED_OUTPATIENT_CLINIC_OR_DEPARTMENT_OTHER): Payer: Self-pay | Admitting: *Deleted

## 2021-07-05 DIAGNOSIS — R509 Fever, unspecified: Secondary | ICD-10-CM | POA: Insufficient documentation

## 2021-07-05 DIAGNOSIS — K029 Dental caries, unspecified: Secondary | ICD-10-CM | POA: Insufficient documentation

## 2021-07-05 DIAGNOSIS — Z87891 Personal history of nicotine dependence: Secondary | ICD-10-CM | POA: Insufficient documentation

## 2021-07-05 HISTORY — DX: Attention-deficit hyperactivity disorder, unspecified type: F90.9

## 2021-07-05 MED ORDER — AMOXICILLIN-POT CLAVULANATE 875-125 MG PO TABS
1.0000 | ORAL_TABLET | Freq: Two times a day (BID) | ORAL | 0 refills | Status: DC
Start: 2021-07-05 — End: 2022-05-04
  Filled 2021-07-05: qty 14, 7d supply, fill #0

## 2021-07-05 NOTE — Discharge Instructions (Addendum)
Call your primary care doctor or specialist as discussed in the next 2-3 days.   Return immediately back to the ER if:  Your symptoms worsen within the next 12-24 hours. You develop new symptoms such as new fevers, persistent vomiting, new pain, shortness of breath, or new weakness or numbness, or if you have any other concerns.  

## 2021-07-05 NOTE — ED Provider Notes (Signed)
[facility] EMERGENCY DEPARTMENT Provider Note   CSN: [id number] Arrival date & time: 07/05/21  1303     History Chief Complaint  Patient presents with   Dental Pain    Angela [name] is a 30 y.o. female.  Patient presents to the ER chief complaint of left-sided dental pain.  She states it is been ongoing for 3 to 4 days.  Subjective fevers at home.  No vomiting or diarrhea.  Denies headache or chest pain or abdominal pain.  She is been try to see a dentist but has not able to do so as she lost insurance.      Past Medical History:  Diagnosis Date   ADHD    Bipolar 1 disorder (HCC)    Depression     There are no problems to display for this patient.   History reviewed. No pertinent surgical history.   OB History   No obstetric history on file.     Family History  Problem Relation Age of Onset   COPD Mother    Hypertension Father     Social History   Tobacco Use   Smoking status: Former    Packs/day: 0.15    Types: Cigarettes   Smokeless tobacco: Never   Tobacco comments:    reports that she stopped June 2020  Vaping Use   Vaping Use: Never used  Substance Use Topics   Alcohol use: Yes    Comment: every weekend   Drug use: No    Home Medications Prior to Admission medications   Medication Sig Start Date End Date Taking? Authorizing Provider  amoxicillin-clavulanate (AUGMENTIN) 875-125 MG tablet Take 1 tablet by mouth every 12 (twelve) hours. 07/05/21  Yes Ilissa Rosner, Eustace Moore, MD  metroNIDAZOLE (FLAGYL) 500 MG tablet Take 1 tablet (500 mg total) by mouth 2 (two) times daily. One po bid x 7 days 03/08/21   Molpus, John, MD  metroNIDAZOLE (FLAGYL) 500 MG tablet Take 1 tablet (500 mg total) by mouth 2 (two) times daily. 04/10/21   Mannie Stabile, PA-C    Allergies    Patient has no known allergies.  Review of Systems   Review of Systems  Constitutional:  Negative for unexpected weight change.  HENT:  Negative for ear pain.   Eyes:  Negative  for pain.  Respiratory:  Negative for cough.   Cardiovascular:  Negative for chest pain.  Gastrointestinal:  Negative for abdominal pain.  Genitourinary:  Negative for flank pain.  Musculoskeletal:  Negative for back pain.  Skin:  Negative for rash.  Neurological:  Negative for headaches.   Physical Exam Updated Vital Signs BP 140/88 (BP Location: Left Arm)   Pulse 97   Temp 98.8 F (37.1 C) (Oral)   Resp 18   Ht 5\' 5"  (1.651 m)   Wt 100.7 kg   SpO2 98%   BMI 36.94 kg/m   Physical Exam Constitutional:      General: She is not in acute distress.    Appearance: Normal appearance.  HENT:     Head: Normocephalic.     Nose: Nose normal.     Mouth/Throat:     Comments: Dental cavities and poor dentition left upper molar and premolar region.  Tenderness palpation of the tooth ER.  No gumline swelling or abscess noted. Eyes:     Extraocular Movements: Extraocular movements intact.  Cardiovascular:     Rate and Rhythm: Normal rate.  Pulmonary:     Effort: Pulmonary effort is normal.  Musculoskeletal:        General: Normal range of motion.     Cervical back: Normal range of motion.  Neurological:     General: No focal deficit present.     Mental Status: She is alert. Mental status is at baseline.    ED Results / Procedures / Treatments   Labs (all labs ordered are listed, but only abnormal results are displayed) Labs Reviewed - No data to display  EKG None  Radiology No results found.  Procedures Procedures   Medications Ordered in ED Medications - No data to display  ED Course  I have reviewed the triage vital signs and the nursing notes.  Pertinent labs & imaging results that were available during my care of the patient were reviewed by me and considered in my medical decision making (see chart for details).    MDM Rules/Calculators/A&P                           Patient presents with dental cavity infection.  Will give prescription antibiotics and list  of dentist to follow-up with.  Advised immediate return if she has persistent fevers worsening pain vomiting or any additional concerns.  Final Clinical Impression(s) / ED Diagnoses Final diagnoses:  Pain due to dental caries    Rx / DC Orders ED Discharge Orders          Ordered    amoxicillin-clavulanate (AUGMENTIN) 875-125 MG tablet  Every 12 hours        07/05/21 1332             Cheryll Cockayne, MD 07/05/21 1332

## 2021-07-05 NOTE — ED Triage Notes (Signed)
C/o dental pain and left ear pain x 4 days

## 2021-08-09 ENCOUNTER — Other Ambulatory Visit (HOSPITAL_BASED_OUTPATIENT_CLINIC_OR_DEPARTMENT_OTHER): Payer: Self-pay

## 2021-08-09 ENCOUNTER — Encounter (HOSPITAL_BASED_OUTPATIENT_CLINIC_OR_DEPARTMENT_OTHER): Payer: Self-pay | Admitting: *Deleted

## 2021-08-09 ENCOUNTER — Emergency Department (HOSPITAL_BASED_OUTPATIENT_CLINIC_OR_DEPARTMENT_OTHER)
Admission: EM | Admit: 2021-08-09 | Discharge: 2021-08-09 | Disposition: A | Discharge status: Home / Self Care | Payer: Self-pay | Attending: Emergency Medicine | Admitting: Emergency Medicine

## 2021-08-09 ENCOUNTER — Other Ambulatory Visit: Payer: Self-pay

## 2021-08-09 DIAGNOSIS — R000 Tachycardia, unspecified: Secondary | ICD-10-CM | POA: Insufficient documentation

## 2021-08-09 DIAGNOSIS — E876 Hypokalemia: Secondary | ICD-10-CM | POA: Insufficient documentation

## 2021-08-09 DIAGNOSIS — Z28310 Unvaccinated for covid-19: Secondary | ICD-10-CM | POA: Insufficient documentation

## 2021-08-09 DIAGNOSIS — Z87891 Personal history of nicotine dependence: Secondary | ICD-10-CM | POA: Insufficient documentation

## 2021-08-09 DIAGNOSIS — U071 COVID-19: Secondary | ICD-10-CM | POA: Insufficient documentation

## 2021-08-09 LAB — COMPREHENSIVE METABOLIC PANEL
ALT: 20 U/L (ref 0–44)
AST: 21 U/L (ref 15–41)
Albumin: 3.5 g/dL (ref 3.5–5.0)
Alkaline Phosphatase: 69 U/L (ref 38–126)
Anion gap: 8 (ref 5–15)
BUN: 5 mg/dL — ABNORMAL LOW (ref 6–20)
CO2: 21 mmol/L — ABNORMAL LOW (ref 22–32)
Calcium: 8.6 mg/dL — ABNORMAL LOW (ref 8.9–10.3)
Chloride: 106 mmol/L (ref 98–111)
Creatinine, Ser: 0.8 mg/dL (ref 0.44–1.00)
GFR, Estimated: >60 mL/min (ref >60)
Glucose, Bld: 103 mg/dL — ABNORMAL HIGH (ref 70–99)
Potassium: 3.4 mmol/L — ABNORMAL LOW (ref 3.5–5.1)
Sodium: 135 mmol/L (ref 135–145)
Total Bilirubin: 0.2 mg/dL — ABNORMAL LOW (ref 0.3–1.2)
Total Protein: 6.6 g/dL (ref 6.5–8.1)

## 2021-08-09 LAB — PREGNANCY, URINE: Preg Test, Ur: NEGATIVE

## 2021-08-09 LAB — CBC WITH DIFFERENTIAL/PLATELET
Abs Immature Granulocytes: 0.05 10*3/uL (ref 0.00–0.07)
Basophils Absolute: 0 10*3/uL (ref 0.0–0.1)
Basophils Relative: 0 %
Eosinophils Absolute: 0 10*3/uL (ref 0.0–0.5)
Eosinophils Relative: 0 %
HCT: 38.4 % (ref 36.0–46.0)
Hemoglobin: 13.2 g/dL (ref 12.0–15.0)
Immature Granulocytes: 1 %
Lymphocytes Relative: 3 %
Lymphs Abs: 0.3 10*3/uL — ABNORMAL LOW (ref 0.7–4.0)
MCH: 30.2 pg (ref 26.0–34.0)
MCHC: 34.4 g/dL (ref 30.0–36.0)
MCV: 87.9 fL (ref 80.0–100.0)
Monocytes Absolute: 0.8 10*3/uL (ref 0.1–1.0)
Monocytes Relative: 8 %
Neutro Abs: 8.4 10*3/uL — ABNORMAL HIGH (ref 1.7–7.7)
Neutrophils Relative %: 88 %
Platelets: 223 10*3/uL (ref 150–400)
RBC: 4.37 MIL/uL (ref 3.87–5.11)
RDW: 14.3 % (ref 11.5–15.5)
WBC: 9.5 10*3/uL (ref 4.0–10.5)
nRBC: 0 % (ref 0.0–0.2)

## 2021-08-09 LAB — RESP PANEL BY RT-PCR (FLU A&B, COVID) ARPGX2
Influenza A by PCR: NEGATIVE
Influenza B by PCR: NEGATIVE
SARS Coronavirus 2 by RT PCR: POSITIVE — AB

## 2021-08-09 LAB — URINALYSIS, ROUTINE W REFLEX MICROSCOPIC
Bilirubin Urine: NEGATIVE
Glucose, UA: NEGATIVE mg/dL
Hgb urine dipstick: NEGATIVE
Ketones, ur: NEGATIVE mg/dL
Leukocytes,Ua: NEGATIVE
Nitrite: NEGATIVE
Protein, ur: NEGATIVE mg/dL
Specific Gravity, Urine: 1.005 (ref 1.005–1.030)
pH: 6.5 (ref 5.0–8.0)

## 2021-08-09 MED ORDER — NIRMATRELVIR/RITONAVIR (PAXLOVID)TABLET
3.0000 | ORAL_TABLET | Freq: Two times a day (BID) | ORAL | 0 refills | Status: AC
  Filled 2021-08-09: qty 30, 5d supply, fill #0

## 2021-08-09 MED ORDER — ONDANSETRON 4 MG PO TBDP
4.0000 mg | ORAL_TABLET | Freq: Three times a day (TID) | ORAL | 0 refills | Status: DC | PRN
  Filled 2021-08-09: qty 20, 7d supply, fill #0

## 2021-08-09 MED ORDER — ONDANSETRON HCL 4 MG/2ML IJ SOLN
4.0000 mg | Freq: Once | INTRAMUSCULAR | Status: AC
  Administered 2021-08-09: 4 mg via INTRAVENOUS
  Filled 2021-08-09: qty 2

## 2021-08-09 MED ORDER — ACETAMINOPHEN 325 MG PO TABS
650.0000 mg | ORAL_TABLET | Freq: Once | ORAL | Status: AC
  Administered 2021-08-09: 650 mg via ORAL
  Filled 2021-08-09: qty 2

## 2021-08-09 MED ORDER — SODIUM CHLORIDE 0.9 % IV BOLUS
1000.0000 mL | Freq: Once | INTRAVENOUS | Status: AC
  Administered 2021-08-09: 1000 mL via INTRAVENOUS

## 2021-08-09 NOTE — Discharge Instructions (Addendum)
You were seen in the Emergency Department for body aches, fever, vomiting, diarrhea, and cough. Your testing revealed you have COVID-19. You should quarantine for at least 5 days per [facility] guidelines. You were treated with IV fluids and anti-nausea medication. We have sent a prescription for nausea medication as well as Paxlovid to your pharmacy. Reasons to return to the ED: Difficulty breathing Unable to eat/drink for more than 24 hours

## 2021-08-09 NOTE — ED Triage Notes (Signed)
Vomiting, body aches, chills, fever, diarrhea, sore throat since last night. She feels she has Covid.

## 2021-08-09 NOTE — ED Notes (Signed)
Patient has received discharge information and IV removed. Waiting for ride home.

## 2021-08-09 NOTE — ED Provider Notes (Signed)
[facility] EMERGENCY DEPARTMENT Provider Note   CSN: [id number] Arrival date & time: 08/09/21  1105    History Chief Complaint  Patient presents with   Emesis   Diarrhea    Angela [name] is a 30 y.o. female presenting with fever, body aches, vomiting, and diarrhea. She also endorses cough and runny nose. Patient's symptoms began yesterday evening. She reports 7 episodes of non-bloody non-bilious emesis since that time and several episodes of diarrhea. Tmax of 103.2 at home this morning at 5am. She took Nyquil with some improvement in her temp. No abdominal pain, no urinary symptoms, no rash. States this feels exactly the same as when she had COVID 9 months ago. No loss of taste/smell. No chest pain or difficulty breathing. She is not vaccinated against COVID. +sick contacts at home (niece sick with similar symptoms).   Past Medical History:  Diagnosis Date   ADHD    Bipolar 1 disorder (HCC)    Depression     There are no problems to display for this patient.   History reviewed. No pertinent surgical history.   OB History   No obstetric history on file.     Family History  Problem Relation Age of Onset   COPD Mother    Hypertension Father     Social History   Tobacco Use   Smoking status: Former    Packs/day: 0.15    Types: Cigarettes   Smokeless tobacco: Never   Tobacco comments:    reports that she stopped June 2020  Vaping Use   Vaping Use: Never used  Substance Use Topics   Alcohol use: Yes    Comment: every weekend   Drug use: No    Home Medications Prior to Admission medications   Medication Sig Start Date End Date Taking? Authorizing Provider  nirmatrelvir/ritonavir EUA (PAXLOVID) 20 x 150 MG & 10 x 100MG  TABS Take 3 tablets by mouth 2 (two) times daily for 5 days.  Take nirmatrelvir (150 mg) two tablets twice daily for 5 days and ritonavir (100 mg) one tablet twice daily for 5 days. 08/09/21 08/14/21 Yes 10/14/21, MD  ondansetron  (ZOFRAN ODT) 4 MG disintegrating tablet Take 1 tablet (4 mg total) by mouth every 8 (eight) hours as needed for nausea or vomiting. 08/09/21  Yes 10/09/21, MD  amoxicillin-clavulanate (AUGMENTIN) 875-125 MG tablet Take 1 tablet by mouth every 12 (twelve) hours. 07/05/21   07/07/21, MD  metroNIDAZOLE (FLAGYL) 500 MG tablet Take 1 tablet (500 mg total) by mouth 2 (two) times daily. One po bid x 7 days 03/08/21   Molpus, John, MD  metroNIDAZOLE (FLAGYL) 500 MG tablet Take 1 tablet (500 mg total) by mouth 2 (two) times daily. 04/10/21   06/10/21, PA-C    Allergies    Patient has no known allergies.  Review of Systems   Review of Systems  Constitutional:  Positive for chills and fever.  HENT:  Positive for rhinorrhea.   Respiratory:  Positive for cough. Negative for shortness of breath.   Cardiovascular:  Negative for chest pain.  Gastrointestinal:  Positive for diarrhea and vomiting. Negative for abdominal pain.  Musculoskeletal:  Positive for myalgias.  Skin:  Negative for rash.   Physical Exam Updated Vital Signs BP 120/78 (BP Location: Left Arm)   Pulse (!) 107   Temp 100.2 F (37.9 C) (Oral)   Resp 20   Ht 5\' 5"  (1.651 m)   Wt 99.3 kg   LMP  08/02/2021   SpO2 96%   BMI 36.44 kg/m   Physical Exam Constitutional:      Appearance: She is not toxic-appearing.  HENT:     Mouth/Throat:     Mouth: Mucous membranes are moist.     Pharynx: Oropharynx is clear.  Cardiovascular:     Rate and Rhythm: Normal rate and regular rhythm.  Pulmonary:     Effort: Pulmonary effort is normal.     Breath sounds: Normal breath sounds.  Abdominal:     Palpations: Abdomen is soft.     Tenderness: There is no abdominal tenderness.  Musculoskeletal:     Cervical back: No rigidity.  Skin:    General: Skin is warm and dry.     Findings: No rash.  Neurological:     General: No focal deficit present.     Mental Status: She is alert.    ED Results / Procedures / Treatments    Labs (all labs ordered are listed, but only abnormal results are displayed) Labs Reviewed  RESP PANEL BY RT-PCR (FLU A&B, COVID) ARPGX2 - Abnormal; Notable for the following components:      Result Value   SARS Coronavirus 2 by RT PCR POSITIVE (*)    All other components within normal limits  CBC WITH DIFFERENTIAL/PLATELET - Abnormal; Notable for the following components:   Neutro Abs 8.4 (*)    Lymphs Abs 0.3 (*)    All other components within normal limits  COMPREHENSIVE METABOLIC PANEL - Abnormal; Notable for the following components:   Potassium 3.4 (*)    CO2 21 (*)    Glucose, Bld 103 (*)    BUN 5 (*)    Calcium 8.6 (*)    Total Bilirubin 0.2 (*)    All other components within normal limits  URINALYSIS, ROUTINE W REFLEX MICROSCOPIC  PREGNANCY, URINE    EKG None  Radiology No results found.  Procedures Procedures   Medications Ordered in ED Medications  acetaminophen (TYLENOL) tablet 650 mg (650 mg Oral Given 08/09/21 1138)  sodium chloride 0.9 % bolus 1,000 mL (0 mLs Intravenous Stopped 08/09/21 1311)  ondansetron (ZOFRAN) injection 4 mg (4 mg Intravenous Given 08/09/21 1206)    ED Course  I have reviewed the triage vital signs and the nursing notes.  Pertinent labs & imaging results that were available during my care of the patient were reviewed by me and considered in my medical decision making (see chart for details).    MDM Rules/Calculators/A&P                         This is a 30 year old female presenting with fever, body aches, cough, vomiting and diarrhea in the setting of sick contacts.  Her initial vitals revealed temp of 100.2 and mild tachycardia to 107. On exam she is uncomfortable appearing but non-toxic, with normal respiratory effort and no focal findings. Will check COVID swab as well as CBC, CMP to evaluate for significant metabolic/electrolyte derangements in light of her GI losses.  Differential includes COVID-19, viral URI, flu, viral GI  illness, dehydration, UTI, pregnancy.   Labs remarkable for mild hypokalemia (K 3.4) and bicarb of 21 but CBC and CMP otherwise normal. Found to be COVID positive.  She is feeling somewhat improved with Zofran and 1L fluid bolus. Reassuringly she does not have any difficulty breathing or other signs of severe illness. Stable for discharge home with Rx for Paxlovid and Zofran ODT. No contraindications to  Paxlovid use. Discussed quarantine guidelines.  Final Clinical Impression(s) / ED Diagnoses Final diagnoses:  COVID-19    Rx / DC Orders ED Discharge Orders          Ordered    nirmatrelvir/ritonavir EUA (PAXLOVID) 20 x 150 MG & 10 x 100MG  TABS  2 times daily        08/09/21 1303    ondansetron (ZOFRAN ODT) 4 MG disintegrating tablet  Every 8 hours PRN        08/09/21 1303           10/09/21, MD PGY-2 [facility]   [facility], MD 08/09/21 1418    10/09/21, MD 08/10/21 1517

## 2022-05-03 ENCOUNTER — Other Ambulatory Visit: Payer: Self-pay

## 2022-05-03 ENCOUNTER — Emergency Department (HOSPITAL_BASED_OUTPATIENT_CLINIC_OR_DEPARTMENT_OTHER): Payer: Self-pay

## 2022-05-03 ENCOUNTER — Emergency Department (HOSPITAL_BASED_OUTPATIENT_CLINIC_OR_DEPARTMENT_OTHER)
Admission: EM | Admit: 2022-05-03 | Discharge: 2022-05-04 | Disposition: A | Discharge status: Home / Self Care | Payer: Self-pay | Attending: Emergency Medicine | Admitting: Emergency Medicine

## 2022-05-03 ENCOUNTER — Encounter (HOSPITAL_BASED_OUTPATIENT_CLINIC_OR_DEPARTMENT_OTHER): Payer: Self-pay | Admitting: Emergency Medicine

## 2022-05-03 DIAGNOSIS — R509 Fever, unspecified: Secondary | ICD-10-CM | POA: Insufficient documentation

## 2022-05-03 DIAGNOSIS — H5711 Ocular pain, right eye: Secondary | ICD-10-CM | POA: Insufficient documentation

## 2022-05-03 DIAGNOSIS — R0981 Nasal congestion: Secondary | ICD-10-CM | POA: Insufficient documentation

## 2022-05-03 DIAGNOSIS — R519 Headache, unspecified: Secondary | ICD-10-CM | POA: Insufficient documentation

## 2022-05-03 DIAGNOSIS — Z20822 Contact with and (suspected) exposure to covid-19: Secondary | ICD-10-CM | POA: Insufficient documentation

## 2022-05-03 DIAGNOSIS — J3489 Other specified disorders of nose and nasal sinuses: Secondary | ICD-10-CM | POA: Insufficient documentation

## 2022-05-03 DIAGNOSIS — J029 Acute pharyngitis, unspecified: Secondary | ICD-10-CM | POA: Insufficient documentation

## 2022-05-03 DIAGNOSIS — R197 Diarrhea, unspecified: Secondary | ICD-10-CM | POA: Insufficient documentation

## 2022-05-03 DIAGNOSIS — R110 Nausea: Secondary | ICD-10-CM | POA: Insufficient documentation

## 2022-05-03 DIAGNOSIS — R059 Cough, unspecified: Secondary | ICD-10-CM | POA: Insufficient documentation

## 2022-05-03 LAB — RESP PANEL BY RT-PCR (FLU A&B, COVID) ARPGX2
Influenza A by PCR: NEGATIVE
Influenza B by PCR: NEGATIVE
SARS Coronavirus 2 by RT PCR: NEGATIVE

## 2022-05-03 MED ORDER — DIPHENHYDRAMINE HCL 50 MG/ML IJ SOLN
25.0000 mg | Freq: Once | INTRAMUSCULAR | Status: AC
  Administered 2022-05-03: 25 mg via INTRAMUSCULAR
  Filled 2022-05-03: qty 1

## 2022-05-03 MED ORDER — KETOROLAC TROMETHAMINE 30 MG/ML IJ SOLN
30.0000 mg | Freq: Once | INTRAMUSCULAR | Status: AC
  Administered 2022-05-03: 30 mg via INTRAMUSCULAR
  Filled 2022-05-03: qty 1

## 2022-05-03 MED ORDER — METOCLOPRAMIDE HCL 5 MG/ML IJ SOLN
10.0000 mg | Freq: Once | INTRAMUSCULAR | Status: AC
  Administered 2022-05-03: 10 mg via INTRAMUSCULAR
  Filled 2022-05-03: qty 2
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
## 2022-05-03 NOTE — ED Notes (Signed)
Pt ambulatory to restroom with independent steady gait °

## 2022-05-03 NOTE — ED Triage Notes (Signed)
Pt c/o migraine headache with cough and diarrhea. Co-worker positive for Covid.

## 2022-05-03 NOTE — ED Provider Notes (Signed)
[facility] EMERGENCY DEPARTMENT Provider Note   CSN: [id number] Arrival date & time: 05/03/22  2112     History  Chief Complaint  Patient presents with   URI    Angela [name] is a 31 y.o. female.  Patient with "migraine headache" that onset this morning.  She describes gradual onset diffuse headache similar to previous migraines and denies thunderclap onset.  She is having severe eye pain and photophobia to her right eye which is also typical for her migraines.  Denies any visual loss.  Denies any blurry vision.  Nausea but no vomiting.  Reports temperature at home was about 100 but does not know the other numbers because her thermometer is not functioning properly.  She is also had cough, congestion, sore throat, diarrhea.  Was exposed to COVID by a coworker.  She denies any chest pain or shortness of breath.  The history is provided by the patient.  URI Presenting symptoms: congestion, cough, fever, rhinorrhea and sore throat   Associated symptoms: headaches   Associated symptoms: no arthralgias, no myalgias and no neck pain       Home Medications Prior to Admission medications   Medication Sig Start Date End Date Taking? Authorizing Provider  amoxicillin-clavulanate (AUGMENTIN) 875-125 MG tablet Take 1 tablet by mouth every 12 (twelve) hours. 07/05/21   Cheryll Cockayne, MD  metroNIDAZOLE (FLAGYL) 500 MG tablet Take 1 tablet (500 mg total) by mouth 2 (two) times daily. One po bid x 7 days 03/08/21   Molpus, John, MD  metroNIDAZOLE (FLAGYL) 500 MG tablet Take 1 tablet (500 mg total) by mouth 2 (two) times daily. 04/10/21   Mannie Stabile, PA-C  ondansetron (ZOFRAN ODT) 4 MG disintegrating tablet Take 1 tablet (4 mg total) by mouth every 8 (eight) hours as needed for nausea or vomiting. 08/09/21   Maury Dus, MD      Allergies    Patient has no known allergies.    Review of Systems   Review of Systems  Constitutional:  Positive for fever. Negative for activity  change and appetite change.  HENT:  Positive for congestion, rhinorrhea and sore throat.   Respiratory:  Positive for cough. Negative for chest tightness.   Cardiovascular:  Negative for chest pain.  Gastrointestinal:  Positive for nausea. Negative for abdominal pain and vomiting.  Genitourinary:  Negative for dysuria.  Musculoskeletal:  Negative for arthralgias, myalgias and neck pain.  Neurological:  Positive for headaches. Negative for weakness.   all other systems are negative except as noted in the HPI and PMH.   Physical Exam Updated Vital Signs BP 118/73   Pulse 80   Temp 98.3 F (36.8 C) (Oral)   Resp 16   Ht 5\' 5"  (1.651 m)   Wt 99.8 kg   SpO2 97%   BMI 36.61 kg/m  Physical Exam Vitals and nursing note reviewed.  Constitutional:      General: She is not in acute distress.    Appearance: She is well-developed. She is not ill-appearing.  HENT:     Head: Normocephalic and atraumatic.     Mouth/Throat:     Pharynx: No oropharyngeal exudate.  Eyes:     Conjunctiva/sclera: Conjunctivae normal.     Pupils: Pupils are equal, round, and reactive to light.     Comments: photophobia  Neck:     Comments: No meningismus. Cardiovascular:     Rate and Rhythm: Normal rate and regular rhythm.     Heart sounds: Normal heart  sounds. No murmur heard. Pulmonary:     Effort: Pulmonary effort is normal. No respiratory distress.     Breath sounds: Normal breath sounds.  Chest:     Chest wall: No tenderness.  Abdominal:     Palpations: Abdomen is soft.     Tenderness: There is no abdominal tenderness. There is no guarding or rebound.  Musculoskeletal:        General: No tenderness. Normal range of motion.     Cervical back: Normal range of motion and neck supple.  Skin:    General: Skin is warm.  Neurological:     Mental Status: She is alert and oriented to person, place, and time.     Cranial Nerves: No cranial nerve deficit.     Motor: No abnormal muscle tone.      Coordination: Coordination normal.     Comments:  5/5 strength throughout. CN 2-12 intact.Equal grip strength.   Psychiatric:        Behavior: Behavior normal.    ED Results / Procedures / Treatments   Labs (all labs ordered are listed, but only abnormal results are displayed) Labs Reviewed  RESP PANEL BY RT-PCR (FLU A&B, COVID) ARPGX2    EKG None  Radiology CT Head Wo Contrast  Result Date: 05/03/2022 CLINICAL DATA:  Headache, new or worsening. Migraine headache with cough and diarrhea. EXAM: CT HEAD WITHOUT CONTRAST TECHNIQUE: Contiguous axial images were obtained from the base of the skull through the vertex without intravenous contrast. RADIATION DOSE REDUCTION: This exam was performed according to the departmental dose-optimization program which includes automated exposure control, adjustment of the mA and/or kV according to patient size and/or use of iterative reconstruction technique. COMPARISON:  CT head dated June 20, 2013. FINDINGS: Brain: No evidence of acute infarction, hemorrhage, hydrocephalus, extra-axial collection or mass lesion/mass effect. Vascular: No hyperdense vessel or unexpected calcification. Skull: Normal. Negative for fracture or focal lesion. Sinuses/Orbits: No acute finding. Other: None. IMPRESSION: No acute intracranial abnormality. Electronically Signed   By: Larose HiresImran  Ahmed D.O.   On: 05/03/2022 23:05    Procedures Procedures    Medications Ordered in ED Medications  ketorolac (TORADOL) 30 MG/ML injection 30 mg (has no administration in time range)  metoCLOPramide (REGLAN) injection 10 mg (has no administration in time range)  diphenhydrAMINE (BENADRYL) injection 25 mg (has no administration in time range)    ED Course/ Medical Decision Making/ A&P                           Medical Decision Making Amount and/or Complexity of Data Reviewed Labs: ordered. Decision-making details documented in ED Course. Radiology: ordered and independent interpretation  performed. Decision-making details documented in ED Course. ECG/medicine tests: ordered and independent interpretation performed. Decision-making details documented in ED Course.  Risk Prescription drug management.  Gradual onset headache with photophobia similar to previous migraines. No fever. Neuro intact.   Severe headache and likely setting of viral illness.  Possibly COVID despite negative test today.  Neurological exam is nonfocal.  Low suspicion for subarachnoid hemorrhage, meningitis, temporal arteritis.  Patient given IM Toradol, Reglan and Benadryl.  She denies possibility of pregnancy.  CT head obtained given lack of previous imaging in the system.  Brain is structurally normal.  Results reviewed and interpreted by me.  Patient sleeping on reassessment.  She will allowed to be metabolize the medications and be reassessed.  Anticipate discharge home when she is feeling improved.  Care to  be transferred at shift change        Final Clinical Impression(s) / ED Diagnoses Final diagnoses:  Bad headache    Rx / DC Orders ED Discharge Orders     None         Kleigh Hoelzer, Jeannett Senior, MD 05/03/22 2327

## 2022-05-03 NOTE — Discharge Instructions (Signed)
Keep yourself hydrated.  Use Tylenol or ibuprofen as needed for aches and fever.  Follow-up with your primary doctor.  Return to the ED with new or worsening symptoms.

## 2022-05-04 NOTE — ED Notes (Signed)
Pt reports she is feeling better and is ready to go home

## 2022-05-04 NOTE — ED Notes (Signed)
Pt A&OX4 ambulatory at d/c with independent steady gait. Pt verbalized understanding of d/c instructions and follow up care. 

## 2022-07-29 ENCOUNTER — Inpatient Hospital Stay (HOSPITAL_BASED_OUTPATIENT_CLINIC_OR_DEPARTMENT_OTHER)
Admission: EM | Admit: 2022-07-29 | Discharge: 2022-08-01 | DRG: 418 | Disposition: A | Discharge status: Home / Self Care | Payer: Self-pay | Attending: Surgery | Admitting: Surgery

## 2022-07-29 ENCOUNTER — Emergency Department (HOSPITAL_BASED_OUTPATIENT_CLINIC_OR_DEPARTMENT_OTHER): Payer: Self-pay

## 2022-07-29 ENCOUNTER — Other Ambulatory Visit: Payer: Self-pay

## 2022-07-29 ENCOUNTER — Encounter (HOSPITAL_BASED_OUTPATIENT_CLINIC_OR_DEPARTMENT_OTHER): Payer: Self-pay | Admitting: Emergency Medicine

## 2022-07-29 DIAGNOSIS — Z8659 Personal history of other mental and behavioral disorders: Secondary | ICD-10-CM

## 2022-07-29 DIAGNOSIS — K5909 Other constipation: Secondary | ICD-10-CM

## 2022-07-29 DIAGNOSIS — Z79899 Other long term (current) drug therapy: Secondary | ICD-10-CM

## 2022-07-29 DIAGNOSIS — K8047 Calculus of bile duct with acute and chronic cholecystitis with obstruction: Principal | ICD-10-CM | POA: Diagnosis present

## 2022-07-29 DIAGNOSIS — Z87891 Personal history of nicotine dependence: Secondary | ICD-10-CM

## 2022-07-29 DIAGNOSIS — K8071 Calculus of gallbladder and bile duct without cholecystitis with obstruction: Principal | ICD-10-CM

## 2022-07-29 DIAGNOSIS — Z6836 Body mass index (BMI) 36.0-36.9, adult: Secondary | ICD-10-CM

## 2022-07-29 DIAGNOSIS — F988 Other specified behavioral and emotional disorders with onset usually occurring in childhood and adolescence: Secondary | ICD-10-CM | POA: Diagnosis present

## 2022-07-29 DIAGNOSIS — E669 Obesity, unspecified: Secondary | ICD-10-CM | POA: Diagnosis present

## 2022-07-29 DIAGNOSIS — K819 Cholecystitis, unspecified: Secondary | ICD-10-CM | POA: Diagnosis present

## 2022-07-29 DIAGNOSIS — K8050 Calculus of bile duct without cholangitis or cholecystitis without obstruction: Secondary | ICD-10-CM

## 2022-07-29 DIAGNOSIS — K7581 Nonalcoholic steatohepatitis (NASH): Secondary | ICD-10-CM | POA: Diagnosis present

## 2022-07-29 DIAGNOSIS — F419 Anxiety disorder, unspecified: Secondary | ICD-10-CM | POA: Diagnosis present

## 2022-07-29 DIAGNOSIS — F1210 Cannabis abuse, uncomplicated: Secondary | ICD-10-CM | POA: Diagnosis present

## 2022-07-29 DIAGNOSIS — F603 Borderline personality disorder: Secondary | ICD-10-CM | POA: Diagnosis present

## 2022-07-29 DIAGNOSIS — F3181 Bipolar II disorder: Secondary | ICD-10-CM | POA: Diagnosis present

## 2022-07-29 DIAGNOSIS — K8000 Calculus of gallbladder with acute cholecystitis without obstruction: Secondary | ICD-10-CM

## 2022-07-29 LAB — URINALYSIS, ROUTINE W REFLEX MICROSCOPIC
Bilirubin Urine: NEGATIVE
Glucose, UA: NEGATIVE mg/dL
Hgb urine dipstick: NEGATIVE
Ketones, ur: NEGATIVE mg/dL
Leukocytes,Ua: NEGATIVE
Nitrite: NEGATIVE
Protein, ur: NEGATIVE mg/dL
Specific Gravity, Urine: 1.01 (ref 1.005–1.030)
pH: 6.5 (ref 5.0–8.0)

## 2022-07-29 LAB — CBC WITH DIFFERENTIAL/PLATELET
Abs Immature Granulocytes: 0.06 10*3/uL (ref 0.00–0.07)
Basophils Absolute: 0 10*3/uL (ref 0.0–0.1)
Basophils Relative: 0 %
Eosinophils Absolute: 0.1 10*3/uL (ref 0.0–0.5)
Eosinophils Relative: 1 %
HCT: 37.3 % (ref 36.0–46.0)
Hemoglobin: 12.7 g/dL (ref 12.0–15.0)
Immature Granulocytes: 1 %
Lymphocytes Relative: 12 %
Lymphs Abs: 1.4 10*3/uL (ref 0.7–4.0)
MCH: 30.3 pg (ref 26.0–34.0)
MCHC: 34 g/dL (ref 30.0–36.0)
MCV: 89 fL (ref 80.0–100.0)
Monocytes Absolute: 0.8 10*3/uL (ref 0.1–1.0)
Monocytes Relative: 6 %
Neutro Abs: 9.6 10*3/uL — ABNORMAL HIGH (ref 1.7–7.7)
Neutrophils Relative %: 80 %
Platelets: 272 10*3/uL (ref 150–400)
RBC: 4.19 MIL/uL (ref 3.87–5.11)
RDW: 13.9 % (ref 11.5–15.5)
WBC: 11.9 10*3/uL — ABNORMAL HIGH (ref 4.0–10.5)
nRBC: 0 % (ref 0.0–0.2)

## 2022-07-29 LAB — COMPREHENSIVE METABOLIC PANEL
ALT: 85 U/L — ABNORMAL HIGH (ref 0–44)
AST: 123 U/L — ABNORMAL HIGH (ref 15–41)
Albumin: 3.7 g/dL (ref 3.5–5.0)
Alkaline Phosphatase: 96 U/L (ref 38–126)
Anion gap: 6 (ref 5–15)
BUN: 11 mg/dL (ref 6–20)
CO2: 26 mmol/L (ref 22–32)
Calcium: 8.7 mg/dL — ABNORMAL LOW (ref 8.9–10.3)
Chloride: 107 mmol/L (ref 98–111)
Creatinine, Ser: 0.73 mg/dL (ref 0.44–1.00)
GFR, Estimated: >60 mL/min (ref >60)
Glucose, Bld: 107 mg/dL — ABNORMAL HIGH (ref 70–99)
Potassium: 3.7 mmol/L (ref 3.5–5.1)
Sodium: 139 mmol/L (ref 135–145)
Total Bilirubin: 0.9 mg/dL (ref 0.3–1.2)
Total Protein: 6.9 g/dL (ref 6.5–8.1)

## 2022-07-29 LAB — LIPASE, BLOOD: Lipase: 24 U/L (ref 11–51)

## 2022-07-29 LAB — PREGNANCY, URINE: Preg Test, Ur: NEGATIVE

## 2022-07-29 MED ORDER — DICYCLOMINE HCL 10 MG PO CAPS
10.0000 mg | ORAL_CAPSULE | Freq: Once | ORAL | Status: AC
  Administered 2022-07-29: 10 mg via ORAL
  Filled 2022-07-29: qty 1

## 2022-07-29 MED ORDER — ALUM & MAG HYDROXIDE-SIMETH 200-200-20 MG/5ML PO SUSP
30.0000 mL | Freq: Once | ORAL | Status: AC
  Administered 2022-07-29: 30 mL via ORAL
  Filled 2022-07-29: qty 30

## 2022-07-29 MED ORDER — ONDANSETRON HCL 4 MG/2ML IJ SOLN
4.0000 mg | Freq: Once | INTRAMUSCULAR | Status: AC
  Administered 2022-07-29: 4 mg via INTRAVENOUS
  Filled 2022-07-29: qty 2

## 2022-07-29 NOTE — ED Provider Notes (Incomplete)
[facility] EMERGENCY DEPARTMENT Provider Note   CSN: [id number] Arrival date & time: 07/29/22  2037     History  Chief Complaint  Patient presents with  . Chest Pain  . Shortness of Breath    Angela [name] is a 31 y.o. female with no known past medical history presenting today with chest pain and vomiting.  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  After vomiting she began to have some chest discomfort and shortness of breath that she thinks could be a panic attack but said that she had a similar episode around a week ago and decided she needed to get checked out.  Denies any shortness of breath, dizziness, syncope, leg swelling, hemoptysis, OCP or tobacco use.  Does report trying hot sauce just prior to this episode.   Chest Pain Associated symptoms: nausea, shortness of breath and vomiting   Associated symptoms: no fever   Shortness of Breath Associated symptoms: vomiting   Associated symptoms: no fever        Home Medications Prior to Admission medications   Not on File      Allergies    Patient has no known allergies.    Review of Systems   Review of Systems  Constitutional:  Negative for chills and fever.  Respiratory:  Positive for chest tightness and shortness of breath.   Gastrointestinal:  Positive for nausea and vomiting. Negative for diarrhea.    Physical Exam Updated Vital Signs BP 103/65   Pulse 72   Temp 98 F (36.7 C) (Oral)   Resp 15   Ht 5\' 5"  (1.651 m)   Wt 98.9 kg   LMP 07/22/2022   SpO2 97%   BMI 36.28 kg/m  Physical Exam Vitals and nursing note reviewed.  Constitutional:      General: She is not in acute distress.    Appearance: Normal appearance. She is not ill-appearing.  HENT:     Head: Normocephalic and atraumatic.  Eyes:     General: No scleral icterus.    Conjunctiva/sclera: Conjunctivae normal.  Cardiovascular:     Rate and Rhythm: Normal rate and regular rhythm.     Heart  sounds: Normal heart sounds.  Pulmonary:     Effort: Pulmonary effort is normal. No accessory muscle usage or respiratory distress.     Breath sounds: Normal breath sounds.  Abdominal:     Palpations: Abdomen is soft.     Tenderness: There is abdominal tenderness (Epigastric and RUQ).  Skin:    General: Skin is warm and dry.     Findings: No rash.  Neurological:     Mental Status: She is alert.  Psychiatric:        Mood and Affect: Mood normal.        Behavior: Behavior normal.     ED Results / Procedures / Treatments   Labs (all labs ordered are listed, but only abnormal results are displayed) Labs Reviewed  CBC WITH DIFFERENTIAL/PLATELET - Abnormal; Notable for the following components:      Result Value   WBC 11.9 (*)    Neutro Abs 9.6 (*)    All other components within normal limits  COMPREHENSIVE METABOLIC PANEL - Abnormal; Notable for the following components:   Glucose, Bld 107 (*)    Calcium 8.7 (*)    AST 123 (*)    ALT 85 (*)    All other components within normal limits  LIPASE, BLOOD  URINALYSIS, ROUTINE W REFLEX MICROSCOPIC  PREGNANCY, URINE    EKG None  Radiology No results found.  Procedures Procedures   Medications Ordered in ED Medications  alum & mag hydroxide-simeth (MAALOX/MYLANTA) 200-200-20 MG/5ML suspension 30 mL (30 mLs Oral Given 07/29/22 2225)  dicyclomine (BENTYL) capsule 10 mg (10 mg Oral Given 07/29/22 2225)    ED Course/ Medical Decision Making/ A&P                           Medical Decision Making Amount and/or Complexity of Data Reviewed Labs: ordered. Radiology: ordered.  Risk OTC drugs. Prescription drug management.   This patient presents to the ED for concern of vomiting, chest pain and shortness of breath.  Differential includes but is not limited to pancreatitis, cholecystitis, cholelithiasis, hepatitis, GERD, gastritis, ACS, PE and viral illness   This is not an exhaustive differential.    Past Medical History /  Co-morbidities / Social History: No known past medical history, obese   Additional history: Had multiple visits for pelvic pain in 2021 however no other similar encounters   Physical Exam: Pertinent physical exam findings include RUQ/epigastric pain  Lab Tests: I ordered, and personally interpreted labs.  The pertinent results include:  ALT 85, AST 123   Imaging Studies: I ordered and independently visualized and interpreted imaging which showed ***. I agree with the radiologist interpretation.   Cardiac Monitoring:  The patient was maintained on a cardiac monitor.  My attending physician Dr. Rush Landmark viewed and interpreted the cardiac monitored which showed an underlying rhythm of: NSR   Medications: I ordered medication including GI cocktail. Reevaluation of the patient after these medicines showed that the patient improved. I have reviewed the patients home medicines and have made adjustments as needed.  Disposition: 31 year old female presenting today after an episode of emesis and subsequent shortness of breath and chest pain.  Reports a similar episode while on vacation a week ago.  Reports her stomach gets very upset after she eats anything fatty.  PERC negative, low suspicion PE.  On physical exam, her tenderness is more specific with epigastric and right upper quadrant pain.  No risk factors for ACS, EKG stable, no troponins ordered.  AST and ALT are elevated as well so RUQ ultrasound was ordered and is pending at this time. ***   I discussed this case with my attending physician Dr. Marland Kitchen who cosigned this note including patient's presenting symptoms, physical exam, and planned diagnostics and interventions. Attending physician stated agreement with plan or made changes to plan which were implemented.     {Document critical care time when appropriate:1} {Document review of labs and clinical decision tools ie heart score, Chads2Vasc2 etc:1}  {Document your independent review of  radiology images, and any outside records:1} {Document your discussion with family members, caretakers, and with consultants:1} {Document social determinants of health affecting pt's care:1} {Document your decision making why or why not admission, treatments were needed:1} Final Clinical Impression(s) / ED Diagnoses Final diagnoses:  None    Rx / DC Orders ED Discharge Orders     None

## 2022-07-29 NOTE — ED Triage Notes (Signed)
Pt states she was at work and started having vomiting  Pt states then she started having chest pain and felt short of breath  Pt states she took a pepcid without relief but states she threw it up

## 2022-07-29 NOTE — Progress Notes (Signed)
Pt seen in triage for Shortness of breath/Chest pain. Upon arrival pt holding chest and short of breath but able to be calmed down. SpO2 98% on RA, clear bilateral breath sounds. EKG obtained at this time by tech. RT will continue to monitor and be available as needed.

## 2022-07-29 NOTE — ED Provider Notes (Signed)
[facility] EMERGENCY DEPARTMENT Provider Note   CSN: [id number] Arrival date & time: 07/29/22  2037     History  Chief Complaint  Patient presents with   Chest Pain   Shortness of Breath    Angela [name] is a 31 y.o. female with no known past medical history presenting today with chest pain and vomiting.  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  After vomiting she began to have some chest discomfort and shortness of breath that she thinks could be a panic attack but said that she had a similar episode around a week ago and decided she needed to get checked out.  Denies any shortness of breath, dizziness, syncope, leg swelling, hemoptysis, OCP or tobacco use.  Does report trying hot sauce just prior to this episode.   Chest Pain Associated symptoms: nausea, shortness of breath and vomiting   Associated symptoms: no fever   Shortness of Breath Associated symptoms: vomiting   Associated symptoms: no fever        Home Medications Prior to Admission medications   Not on File      Allergies    Patient has no known allergies.    Review of Systems   Review of Systems  Constitutional:  Negative for chills and fever.  Respiratory:  Positive for chest tightness and shortness of breath.   Gastrointestinal:  Positive for nausea and vomiting. Negative for diarrhea.    Physical Exam Updated Vital Signs BP 103/65   Pulse 72   Temp 98 F (36.7 C) (Oral)   Resp 15   Ht 5\' 5"  (1.651 m)   Wt 98.9 kg   LMP 07/22/2022   SpO2 97%   BMI 36.28 kg/m  Physical Exam Vitals and nursing note reviewed.  Constitutional:      General: She is not in acute distress.    Appearance: Normal appearance. She is not ill-appearing.  HENT:     Head: Normocephalic and atraumatic.  Eyes:     General: No scleral icterus.    Conjunctiva/sclera: Conjunctivae normal.  Cardiovascular:     Rate and Rhythm: Normal rate and regular rhythm.     Heart  sounds: Normal heart sounds.  Pulmonary:     Effort: Pulmonary effort is normal. No accessory muscle usage or respiratory distress.     Breath sounds: Normal breath sounds.  Abdominal:     Palpations: Abdomen is soft.     Tenderness: There is abdominal tenderness (Epigastric and RUQ).  Skin:    General: Skin is warm and dry.     Findings: No rash.  Neurological:     Mental Status: She is alert.  Psychiatric:        Mood and Affect: Mood normal.        Behavior: Behavior normal.     ED Results / Procedures / Treatments   Labs (all labs ordered are listed, but only abnormal results are displayed) Labs Reviewed  CBC WITH DIFFERENTIAL/PLATELET - Abnormal; Notable for the following components:      Result Value   WBC 11.9 (*)    Neutro Abs 9.6 (*)    All other components within normal limits  COMPREHENSIVE METABOLIC PANEL - Abnormal; Notable for the following components:   Glucose, Bld 107 (*)    Calcium 8.7 (*)    AST 123 (*)    ALT 85 (*)    All other components within normal limits  LIPASE, BLOOD  URINALYSIS, ROUTINE W REFLEX MICROSCOPIC  PREGNANCY, URINE    EKG None  Radiology US Abdomen Limited RUQ (LIVER/GB)  Result Date: 07/30/2022 CLINICAL DATA:  Right upper quadrant abdominal pain, nausea EXAM: ULTRASOUND ABDOMEN LIMITED RIGHT UPPER QUADRANT COMPARISON:  CT 10/06/2019 FINDINGS: Gallbladder: Multiple layering gallstones are seen within the gallbladder with a 19 mm gallstone seen impacted within the gallbladder neck on decubitus imaging (image # 57, cine images). The gallbladder is distended. There is no gallbladder wall thickening, and no pericholecystic fluid is identified. Common bile duct: Diameter: Dilated, measuring 10 mm in proximal diameter Liver: No focal lesion identified. Within normal limits in parenchymal echogenicity. Portal vein is patent on color Doppler imaging with normal direction of blood flow towards the liver. Other: None. IMPRESSION: 1.  Cholelithiasis with a 19 mm gallstone impacted within the gallbladder neck. 2. Dilated common bile duct measuring 10 mm. A distal obstructing lesion, such as choledocholithiasis, is not excluded. ERCP or MRCP examination is recommended for further evaluation. Electronically Signed   By: Helyn Numbers M.D.   On: 07/30/2022 00:18    Procedures Procedures   Medications Ordered in ED Medications  alum & mag hydroxide-simeth (MAALOX/MYLANTA) 200-200-20 MG/5ML suspension 30 mL (30 mLs Oral Given 07/29/22 2225)  dicyclomine (BENTYL) capsule 10 mg (10 mg Oral Given 07/29/22 2225)    ED Course/ Medical Decision Making/ A&P Clinical Course as of 07/30/22 0027  Tue Jul 30, 2022  0020 US Abdomen Limited RUQ (LIVER/GB) [MR]  0027 I spoke with the patient and her family member on the phone about her gallbladder findings.  She reports her last meal was around 11 this morning when she had a peanut butter and jelly sandwich.  She is agreeable to awaiting surgical consult and potential transfer to [facility] [MR]    Clinical Course User Index [MR] Connar Keating, Gabriel Cirri, PA-C                           Medical Decision Making Amount and/or Complexity of Data Reviewed Labs: ordered. Radiology: ordered.  Risk OTC drugs. Prescription drug management.   This patient presents to the ED for concern of vomiting, chest pain and shortness of breath.  Differential includes but is not limited to pancreatitis, cholecystitis, cholelithiasis, hepatitis, GERD, gastritis, ACS, PE and viral illness   This is not an exhaustive differential.    Past Medical History / Co-morbidities / Social History: No known past medical history, obese   Additional history: Had multiple visits for pelvic pain in 2021 however no other similar encounters   Physical Exam: Pertinent physical exam findings include RUQ/epigastric pain  Lab Tests: I ordered, and personally interpreted labs.  The pertinent results include:   ALT 85, AST 123   Imaging Studies: I ordered and independently visualized and interpreted imaging which showed cholelithiasis. Specifically there is a large 19 mm stone that is impacted in her gallbladder neck. I agree with the radiologist interpretation.   Cardiac Monitoring:  The patient was maintained on a cardiac monitor.  My attending physician Dr. Rush Landmark viewed and interpreted the cardiac monitored which showed an underlying rhythm of: NSR   Medications: I ordered medication including GI cocktail. Reevaluation of the patient after these medicines showed that the patient improved. I have reviewed the patients home medicines and have made adjustments as needed.  Disposition: 31 year old female presenting today after an episode of emesis and subsequent shortness of breath and chest pain.  Reports a similar episode while on vacation a week ago.  Reports her stomach gets very upset after she eats anything fatty.  PERC negative, low suspicion PE.  On physical exam, her tenderness is more specific with epigastric and right upper quadrant pain.  No risk factors for ACS, EKG stable, no troponins ordered.  AST and ALT are elevated as well so right upper quadrant ultrasound was ordered.  This shows cholelithiasis with a 19 mm stone impacted in the gallbladder neck.  At this time surgery will need to be consulted.  Patient handed off to Dr. Ralene Bathe at shift change.  She will speak with the surgeon and disposition the patient accordingly.  Final Clinical Impression(s) / ED Diagnoses Final diagnoses:  Calculus of gallbladder and bile duct with obstruction without cholecystitis     Rhae Hammock, PA-C 07/30/22 0031    Tegeler, Gwenyth Allegra, MD 07/30/22 [phone]

## 2022-07-30 ENCOUNTER — Emergency Department (EMERGENCY_DEPARTMENT_HOSPITAL): Payer: Self-pay | Admitting: Anesthesiology

## 2022-07-30 ENCOUNTER — Encounter (HOSPITAL_COMMUNITY): Admission: EM | Disposition: A | Discharge status: Home / Self Care | Payer: Self-pay | Source: Home / Self Care

## 2022-07-30 ENCOUNTER — Emergency Department (HOSPITAL_COMMUNITY): Payer: Self-pay | Admitting: Anesthesiology

## 2022-07-30 ENCOUNTER — Encounter (HOSPITAL_BASED_OUTPATIENT_CLINIC_OR_DEPARTMENT_OTHER): Payer: Self-pay | Admitting: Surgery

## 2022-07-30 ENCOUNTER — Other Ambulatory Visit: Payer: Self-pay

## 2022-07-30 ENCOUNTER — Emergency Department (HOSPITAL_COMMUNITY): Payer: Self-pay

## 2022-07-30 DIAGNOSIS — K8000 Calculus of gallbladder with acute cholecystitis without obstruction: Secondary | ICD-10-CM | POA: Diagnosis present

## 2022-07-30 DIAGNOSIS — K5909 Other constipation: Secondary | ICD-10-CM

## 2022-07-30 DIAGNOSIS — K8046 Calculus of bile duct with acute and chronic cholecystitis without obstruction: Secondary | ICD-10-CM

## 2022-07-30 DIAGNOSIS — K8050 Calculus of bile duct without cholangitis or cholecystitis without obstruction: Secondary | ICD-10-CM

## 2022-07-30 DIAGNOSIS — K760 Fatty (change of) liver, not elsewhere classified: Secondary | ICD-10-CM

## 2022-07-30 HISTORY — PX: CHOLECYSTECTOMY: SHX55

## 2022-07-30 LAB — HIV ANTIBODY (ROUTINE TESTING W REFLEX): HIV Screen 4th Generation wRfx: NONREACTIVE

## 2022-07-30 SURGERY — LAPAROSCOPIC CHOLECYSTECTOMY WITH INTRAOPERATIVE CHOLANGIOGRAM
Anesthesia: General | Site: Abdomen

## 2022-07-30 MED ORDER — CHLORHEXIDINE GLUCONATE CLOTH 2 % EX PADS: 6.0000 | MEDICATED_PAD | Freq: Once | CUTANEOUS | Status: DC

## 2022-07-30 MED ORDER — ACETAMINOPHEN 160 MG/5ML PO SOLN: 325.0000 mg | ORAL | Status: DC | PRN

## 2022-07-30 MED ORDER — POTASSIUM CHLORIDE IN NACL 20-0.9 MEQ/L-% IV SOLN
INTRAVENOUS | Status: DC
  Filled 2022-07-30 (×2): qty 1000

## 2022-07-30 MED ORDER — HYDROMORPHONE HCL 1 MG/ML IJ SOLN: 1.0000 mg | INTRAMUSCULAR | Status: DC | PRN

## 2022-07-30 MED ORDER — SODIUM CHLORIDE 0.9 % IV SOLN
2.0000 g | INTRAVENOUS | Status: DC
  Administered 2022-07-31 – 2022-08-01 (×2): 2 g via INTRAVENOUS
  Filled 2022-07-30 (×2): qty 20

## 2022-07-30 MED ORDER — BUPIVACAINE LIPOSOME 1.3 % IJ SUSP
INTRAMUSCULAR | Status: AC
  Filled 2022-07-30: qty 20

## 2022-07-30 MED ORDER — DEXAMETHASONE SODIUM PHOSPHATE 10 MG/ML IJ SOLN
INTRAMUSCULAR | Status: AC
  Filled 2022-07-30: qty 1

## 2022-07-30 MED ORDER — GABAPENTIN 300 MG PO CAPS
300.0000 mg | ORAL_CAPSULE | Freq: Two times a day (BID) | ORAL | Status: DC
  Administered 2022-07-30 – 2022-08-01 (×5): 300 mg via ORAL
  Filled 2022-07-30 (×5): qty 1

## 2022-07-30 MED ORDER — ONDANSETRON HCL 4 MG/2ML IJ SOLN
4.0000 mg | Freq: Four times a day (QID) | INTRAMUSCULAR | Status: DC | PRN
  Administered 2022-07-31 (×2): 4 mg via INTRAVENOUS
  Filled 2022-07-30 (×2): qty 2

## 2022-07-30 MED ORDER — ONDANSETRON HCL 4 MG/2ML IJ SOLN
4.0000 mg | Freq: Once | INTRAMUSCULAR | Status: AC | PRN
  Administered 2022-07-30: 4 mg via INTRAVENOUS

## 2022-07-30 MED ORDER — ONDANSETRON HCL 4 MG/2ML IJ SOLN
INTRAMUSCULAR | Status: AC
  Administered 2022-07-30: 4 mg
  Filled 2022-07-30: qty 2

## 2022-07-30 MED ORDER — PROPOFOL 10 MG/ML IV BOLUS
INTRAVENOUS | Status: DC | PRN
  Administered 2022-07-30: 160 mg via INTRAVENOUS

## 2022-07-30 MED ORDER — ENOXAPARIN SODIUM 40 MG/0.4ML IJ SOSY
40.0000 mg | PREFILLED_SYRINGE | INTRAMUSCULAR | Status: DC
  Administered 2022-08-01: 40 mg via SUBCUTANEOUS
  Filled 2022-07-30: qty 0.4

## 2022-07-30 MED ORDER — MIDAZOLAM HCL 2 MG/2ML IJ SOLN
INTRAMUSCULAR | Status: AC
  Filled 2022-07-30: qty 2

## 2022-07-30 MED ORDER — SODIUM CHLORIDE 0.9 % IV SOLN
2.0000 g | Freq: Once | INTRAVENOUS | Status: AC
  Administered 2022-07-30: 2 g via INTRAVENOUS
  Filled 2022-07-30: qty 20

## 2022-07-30 MED ORDER — MAGNESIUM HYDROXIDE 400 MG/5ML PO SUSP: 30.0000 mL | Freq: Every day | ORAL | Status: DC | PRN

## 2022-07-30 MED ORDER — ONDANSETRON 4 MG PO TBDP: 4.0000 mg | ORAL_TABLET | Freq: Four times a day (QID) | ORAL | Status: DC | PRN

## 2022-07-30 MED ORDER — GABAPENTIN 300 MG PO CAPS
300.0000 mg | ORAL_CAPSULE | ORAL | Status: AC
  Administered 2022-07-30: 300 mg via ORAL
  Filled 2022-07-30: qty 1

## 2022-07-30 MED ORDER — OXYCODONE HCL 5 MG PO TABS
5.0000 mg | ORAL_TABLET | ORAL | Status: DC | PRN
  Administered 2022-07-30 – 2022-08-01 (×4): 10 mg via ORAL
  Administered 2022-08-01: 5 mg via ORAL
  Filled 2022-07-30: qty 1
  Filled 2022-07-30 (×4): qty 2

## 2022-07-30 MED ORDER — SODIUM CHLORIDE 0.9 % IV SOLN: Freq: Three times a day (TID) | INTRAVENOUS | Status: DC | PRN

## 2022-07-30 MED ORDER — LIDOCAINE 2% (20 MG/ML) 5 ML SYRINGE
INTRAMUSCULAR | Status: AC
  Filled 2022-07-30: qty 5

## 2022-07-30 MED ORDER — SODIUM CHLORIDE (PF) 0.9 % IJ SOLN
INTRAMUSCULAR | Status: DC | PRN
  Administered 2022-07-30: 27 mL

## 2022-07-30 MED ORDER — SODIUM CHLORIDE 0.9 % IV SOLN
2.0000 g | INTRAVENOUS | Status: AC
  Administered 2022-07-30: 2 g via INTRAVENOUS
  Filled 2022-07-30: qty 20

## 2022-07-30 MED ORDER — PROCHLORPERAZINE MALEATE 10 MG PO TABS: 10.0000 mg | ORAL_TABLET | Freq: Four times a day (QID) | ORAL | Status: DC | PRN

## 2022-07-30 MED ORDER — DEXAMETHASONE SODIUM PHOSPHATE 10 MG/ML IJ SOLN
INTRAMUSCULAR | Status: DC | PRN
  Administered 2022-07-30: 10 mg via INTRAVENOUS

## 2022-07-30 MED ORDER — ACETAMINOPHEN 500 MG PO TABS
1000.0000 mg | ORAL_TABLET | Freq: Four times a day (QID) | ORAL | Status: DC
  Administered 2022-07-30 – 2022-08-01 (×5): 1000 mg via ORAL
  Filled 2022-07-30 (×7): qty 2

## 2022-07-30 MED ORDER — DIPHENHYDRAMINE HCL 50 MG/ML IJ SOLN
12.5000 mg | Freq: Four times a day (QID) | INTRAMUSCULAR | Status: DC | PRN
Start: 2022-07-30 — End: 2022-08-01
  Administered 2022-07-31: 12.5 mg via INTRAVENOUS
  Filled 2022-07-30: qty 1

## 2022-07-30 MED ORDER — ACETAMINOPHEN 325 MG PO TABS: 325.0000 mg | ORAL_TABLET | ORAL | Status: DC | PRN

## 2022-07-30 MED ORDER — ONDANSETRON HCL 4 MG/2ML IJ SOLN
4.0000 mg | Freq: Once | INTRAMUSCULAR | Status: DC
  Filled 2022-07-30: qty 2

## 2022-07-30 MED ORDER — FENTANYL CITRATE PF 50 MCG/ML IJ SOSY
50.0000 ug | PREFILLED_SYRINGE | Freq: Once | INTRAMUSCULAR | Status: AC
  Administered 2022-07-30: 50 ug via INTRAVENOUS
  Filled 2022-07-30: qty 1

## 2022-07-30 MED ORDER — BUPIVACAINE HCL (PF) 0.5 % IJ SOLN
INTRAMUSCULAR | Status: DC | PRN
  Administered 2022-07-30: 30 mL

## 2022-07-30 MED ORDER — KETAMINE HCL 10 MG/ML IJ SOLN
INTRAMUSCULAR | Status: DC | PRN
  Administered 2022-07-30 (×2): 20 mg via INTRAVENOUS

## 2022-07-30 MED ORDER — 0.9 % SODIUM CHLORIDE (POUR BTL) OPTIME
TOPICAL | Status: DC | PRN
  Administered 2022-07-30: 1000 mL

## 2022-07-30 MED ORDER — KETAMINE HCL 50 MG/5ML IJ SOSY
PREFILLED_SYRINGE | INTRAMUSCULAR | Status: AC
  Filled 2022-07-30: qty 5

## 2022-07-30 MED ORDER — MIDAZOLAM HCL 5 MG/5ML IJ SOLN
INTRAMUSCULAR | Status: DC | PRN
  Administered 2022-07-30: 2 mg via INTRAVENOUS

## 2022-07-30 MED ORDER — KETOROLAC TROMETHAMINE 30 MG/ML IJ SOLN
30.0000 mg | Freq: Four times a day (QID) | INTRAMUSCULAR | Status: DC | PRN
  Administered 2022-07-30 – 2022-07-31 (×3): 30 mg via INTRAVENOUS
  Filled 2022-07-30 (×3): qty 1

## 2022-07-30 MED ORDER — ROCURONIUM BROMIDE 10 MG/ML (PF) SYRINGE
PREFILLED_SYRINGE | INTRAVENOUS | Status: DC | PRN
  Administered 2022-07-30: 50 mg via INTRAVENOUS
  Administered 2022-07-30: 20 mg via INTRAVENOUS

## 2022-07-30 MED ORDER — PROPOFOL 10 MG/ML IV BOLUS
INTRAVENOUS | Status: AC
  Filled 2022-07-30: qty 20

## 2022-07-30 MED ORDER — LACTATED RINGERS IV SOLN: INTRAVENOUS | Status: DC

## 2022-07-30 MED ORDER — MEPERIDINE HCL 50 MG/ML IJ SOLN: 6.2500 mg | INTRAMUSCULAR | Status: DC | PRN

## 2022-07-30 MED ORDER — ONDANSETRON HCL 4 MG/2ML IJ SOLN: 4.0000 mg | Freq: Four times a day (QID) | INTRAMUSCULAR | Status: DC | PRN

## 2022-07-30 MED ORDER — ENSURE PRE-SURGERY PO LIQD
296.0000 mL | Freq: Once | ORAL | Status: DC
  Filled 2022-07-30: qty 296

## 2022-07-30 MED ORDER — SIMETHICONE 80 MG PO CHEW
40.0000 mg | CHEWABLE_TABLET | Freq: Four times a day (QID) | ORAL | Status: DC | PRN
  Administered 2022-07-31 – 2022-08-01 (×2): 40 mg via ORAL
  Filled 2022-07-30 (×2): qty 1

## 2022-07-30 MED ORDER — LACTATED RINGERS IR SOLN
Status: DC | PRN
  Administered 2022-07-30 (×2): 1000 mL

## 2022-07-30 MED ORDER — LACTATED RINGERS IV SOLN: INTRAVENOUS | Status: AC

## 2022-07-30 MED ORDER — FENTANYL CITRATE PF 50 MCG/ML IJ SOSY
25.0000 ug | PREFILLED_SYRINGE | INTRAMUSCULAR | Status: DC | PRN
  Administered 2022-07-30: 50 ug via INTRAVENOUS

## 2022-07-30 MED ORDER — BUPIVACAINE LIPOSOME 1.3 % IJ SUSP
INTRAMUSCULAR | Status: DC | PRN
  Administered 2022-07-30: 20 mL

## 2022-07-30 MED ORDER — SUCCINYLCHOLINE CHLORIDE 200 MG/10ML IV SOSY
PREFILLED_SYRINGE | INTRAVENOUS | Status: AC
  Filled 2022-07-30: qty 10

## 2022-07-30 MED ORDER — METOPROLOL TARTRATE 5 MG/5ML IV SOLN: 5.0000 mg | Freq: Four times a day (QID) | INTRAVENOUS | Status: DC | PRN

## 2022-07-30 MED ORDER — BUPIVACAINE HCL (PF) 0.5 % IJ SOLN
INTRAMUSCULAR | Status: AC
  Filled 2022-07-30: qty 30

## 2022-07-30 MED ORDER — CELECOXIB 200 MG PO CAPS
200.0000 mg | ORAL_CAPSULE | ORAL | Status: AC
  Administered 2022-07-30: 200 mg via ORAL
  Filled 2022-07-30: qty 1

## 2022-07-30 MED ORDER — BISACODYL 10 MG RE SUPP: 10.0000 mg | Freq: Every day | RECTAL | Status: DC | PRN

## 2022-07-30 MED ORDER — ONDANSETRON HCL 4 MG/2ML IJ SOLN
INTRAMUSCULAR | Status: AC
  Filled 2022-07-30: qty 2

## 2022-07-30 MED ORDER — FENTANYL CITRATE PF 50 MCG/ML IJ SOSY
PREFILLED_SYRINGE | INTRAMUSCULAR | Status: AC
  Filled 2022-07-30: qty 2

## 2022-07-30 MED ORDER — HYDROMORPHONE HCL 1 MG/ML IJ SOLN: 0.5000 mg | INTRAMUSCULAR | Status: DC | PRN

## 2022-07-30 MED ORDER — PROCHLORPERAZINE EDISYLATE 10 MG/2ML IJ SOLN
5.0000 mg | Freq: Four times a day (QID) | INTRAMUSCULAR | Status: DC | PRN
  Administered 2022-07-31: 5 mg via INTRAVENOUS
  Filled 2022-07-30: qty 2

## 2022-07-30 MED ORDER — LIDOCAINE 2% (20 MG/ML) 5 ML SYRINGE
INTRAMUSCULAR | Status: DC | PRN
  Administered 2022-07-30: 1.5 mg/kg/h via INTRAVENOUS
  Administered 2022-07-30: 40 mg via INTRAVENOUS

## 2022-07-30 MED ORDER — OXYCODONE HCL 5 MG PO TABS: 5.0000 mg | ORAL_TABLET | Freq: Once | ORAL | Status: DC | PRN

## 2022-07-30 MED ORDER — ACETAMINOPHEN 500 MG PO TABS
1000.0000 mg | ORAL_TABLET | Freq: Four times a day (QID) | ORAL | Status: DC
  Administered 2022-07-30: 1000 mg via ORAL
  Filled 2022-07-30: qty 2

## 2022-07-30 MED ORDER — ROCURONIUM BROMIDE 10 MG/ML (PF) SYRINGE
PREFILLED_SYRINGE | INTRAVENOUS | Status: AC
Start: 2022-07-30 — End: ?
  Filled 2022-07-30: qty 10

## 2022-07-30 MED ORDER — ENOXAPARIN SODIUM 40 MG/0.4ML IJ SOSY
40.0000 mg | PREFILLED_SYRINGE | INTRAMUSCULAR | Status: DC
  Filled 2022-07-30: qty 0.4

## 2022-07-30 MED ORDER — OXYCODONE HCL 5 MG/5ML PO SOLN: 5.0000 mg | Freq: Once | ORAL | Status: DC | PRN

## 2022-07-30 MED ORDER — BUPIVACAINE LIPOSOME 1.3 % IJ SUSP: 20.0000 mL | Freq: Once | INTRAMUSCULAR | Status: DC

## 2022-07-30 MED ORDER — FENTANYL CITRATE (PF) 250 MCG/5ML IJ SOLN
INTRAMUSCULAR | Status: AC
  Filled 2022-07-30: qty 5

## 2022-07-30 MED ORDER — ACETAMINOPHEN 500 MG PO TABS: 1000.0000 mg | ORAL_TABLET | ORAL | Status: DC

## 2022-07-30 MED ORDER — FENTANYL CITRATE (PF) 100 MCG/2ML IJ SOLN
INTRAMUSCULAR | Status: DC | PRN
  Administered 2022-07-30 (×3): 50 ug via INTRAVENOUS

## 2022-07-30 MED ORDER — SUGAMMADEX SODIUM 200 MG/2ML IV SOLN
INTRAVENOUS | Status: DC | PRN
  Administered 2022-07-30: 300 mg via INTRAVENOUS

## 2022-07-30 MED ORDER — TRAMADOL HCL 50 MG PO TABS: 50.0000 mg | ORAL_TABLET | Freq: Four times a day (QID) | ORAL | Status: DC | PRN

## 2022-07-30 MED ORDER — METHOCARBAMOL 500 MG PO TABS
500.0000 mg | ORAL_TABLET | Freq: Four times a day (QID) | ORAL | Status: DC | PRN
  Administered 2022-08-01: 500 mg via ORAL
  Filled 2022-07-30: qty 1

## 2022-07-30 MED ORDER — SODIUM CHLORIDE 0.9 % IV BOLUS
500.0000 mL | Freq: Once | INTRAVENOUS | Status: AC
  Administered 2022-07-30: 500 mL via INTRAVENOUS

## 2022-07-30 MED ORDER — DIPHENHYDRAMINE HCL 12.5 MG/5ML PO ELIX: 12.5000 mg | ORAL_SOLUTION | Freq: Four times a day (QID) | ORAL | Status: DC | PRN

## 2022-07-30 SURGICAL SUPPLY — 43 items
APPLIER CLIP 5 13 M/L LIGAMAX5 (MISCELLANEOUS) ×2
BAG COUNTER SPONGE SURGICOUNT (BAG) IMPLANT
CABLE HIGH FREQUENCY MONO STRZ (ELECTRODE) ×2 IMPLANT
CHLORAPREP W/TINT 26 (MISCELLANEOUS) ×2 IMPLANT
CLIP APPLIE 5 13 M/L LIGAMAX5 (MISCELLANEOUS) ×2 IMPLANT
COVER MAYO STAND STRL (DRAPES) ×2 IMPLANT
COVER SURGICAL LIGHT HANDLE (MISCELLANEOUS) ×2 IMPLANT
DRAIN CHANNEL 19F RND (DRAIN) IMPLANT
DRAPE C-ARM 42X120 X-RAY (DRAPES) ×2 IMPLANT
DRAPE WARM FLUID 44X44 (DRAPES) ×2 IMPLANT
DRSG TEGADERM 4X4.75 (GAUZE/BANDAGES/DRESSINGS) ×2 IMPLANT
ELECT REM PT RETURN 15FT ADLT (MISCELLANEOUS) ×2 IMPLANT
ENDOLOOP SUT PDS II  0 18 (SUTURE) ×2
ENDOLOOP SUT PDS II 0 18 (SUTURE) ×1 IMPLANT
EVACUATOR SILICONE 100CC (DRAIN) IMPLANT
GAUZE SPONGE 2X2 8PLY STRL LF (GAUZE/BANDAGES/DRESSINGS) ×2 IMPLANT
GLOVE ECLIPSE 8.0 STRL XLNG CF (GLOVE) ×2 IMPLANT
GLOVE INDICATOR 8.0 STRL GRN (GLOVE) ×2 IMPLANT
GOWN STRL REUS W/ TWL XL LVL3 (GOWN DISPOSABLE) ×6 IMPLANT
GOWN STRL REUS W/TWL XL LVL3 (GOWN DISPOSABLE) ×6
IRRIG SUCT STRYKERFLOW 2 WTIP (MISCELLANEOUS) ×2
IRRIGATION SUCT STRKRFLW 2 WTP (MISCELLANEOUS) ×2 IMPLANT
KIT BASIN OR (CUSTOM PROCEDURE TRAY) ×2 IMPLANT
KIT TURNOVER KIT A (KITS) IMPLANT
PAD POSITIONING PINK XL (MISCELLANEOUS) ×2 IMPLANT
PENCIL SMOKE EVACUATOR (MISCELLANEOUS) IMPLANT
POUCH RETRIEVAL ECOSAC 10 (ENDOMECHANICALS) IMPLANT
POUCH RETRIEVAL ECOSAC 10MM (ENDOMECHANICALS)
PROTECTOR NERVE ULNAR (MISCELLANEOUS) IMPLANT
SCISSORS LAP 5X35 DISP (ENDOMECHANICALS) ×2 IMPLANT
SET CHOLANGIOGRAPH MIX (MISCELLANEOUS) ×2 IMPLANT
SET TUBE SMOKE EVAC HIGH FLOW (TUBING) ×2 IMPLANT
SHEARS HARMONIC ACE PLUS 36CM (ENDOMECHANICALS) ×2 IMPLANT
SLEEVE Z-THREAD 5X100MM (TROCAR) ×2 IMPLANT
SPIKE FLUID TRANSFER (MISCELLANEOUS) ×2 IMPLANT
SUT MNCRL AB 4-0 PS2 18 (SUTURE) ×2 IMPLANT
SUT PDS AB 1 CT1 27 (SUTURE) ×4 IMPLANT
SYR 20ML LL LF (SYRINGE) ×2 IMPLANT
TOWEL OR 17X26 10 PK STRL BLUE (TOWEL DISPOSABLE) ×2 IMPLANT
TOWEL OR NON WOVEN STRL DISP B (DISPOSABLE) ×2 IMPLANT
TRAY LAPAROSCOPIC (CUSTOM PROCEDURE TRAY) ×2 IMPLANT
TROCAR 11X100 Z THREAD (TROCAR) ×1 IMPLANT
TROCAR Z-THREAD OPTICAL 5X100M (TROCAR) ×2 IMPLANT

## 2022-07-30 NOTE — Anesthesia Procedure Notes (Signed)
Procedure Name: Intubation Date/Time: 07/30/2022 10:51 AM  Performed by: Cleda Daub, CRNAPre-anesthesia Checklist: Patient identified, Emergency Drugs available, Suction available and Patient being monitored Patient Re-evaluated:Patient Re-evaluated prior to induction Oxygen Delivery Method: Circle system utilized Preoxygenation: Pre-oxygenation with 100% oxygen Induction Type: IV induction Ventilation: Mask ventilation without difficulty Laryngoscope Size: Mac and 3 Grade View: Grade I Tube type: Oral Tube size: 7.0 mm Number of attempts: 1 (intubated by Gunnar Fusi, parametric student) Airway Equipment and Method: Stylet and Oral airway Placement Confirmation: ETT inserted through vocal cords under direct vision, positive ETCO2 and breath sounds checked- equal and bilateral Secured at: 21 cm Tube secured with: Tape Dental Injury: Teeth and Oropharynx as per pre-operative assessment

## 2022-07-30 NOTE — Progress Notes (Signed)
Angela [name]  02-Oct-1991 [id number]  Patient Care Team: Patient, No Pcp Per as PCP - General (General Practice)   Dr. Magnus Ivan on-call last night received call from [facility] emergency department team requesting consultation and probable admission for pt with classic story/exam/workup of acute cholecystitis.  Dr Magnus Ivan offered admission to [facility] with probable need for cholecystectomy.  Patient on antibiotics with nausea and pain control.  Unfortunately due to staffing issues no beds available at any campus in [place].    I came on this morning at 7 AM.  I have discussed with [facility] main OR and they are going to try and have the patient come to Short Stay directly to have a chance to do cholecystectomy this morning.  Called and made a message through [facility] and they are working to happen this happen.  Full evaluation to follow when the patient is at the hospital.  Patient Active Problem List   Diagnosis Date Noted   Acute calculous cholecystitis 07/30/2022    Past Medical History:  Diagnosis Date   ADHD    Bipolar 1 disorder (HCC)    Depression     History reviewed. No pertinent surgical history.  Social History   Socioeconomic History   Marital status: Single    Spouse name: Not on file   Number of children: Not on file   Years of education: Not on file   Highest education level: Not on file  Occupational History   Not on file  Tobacco Use   Smoking status: Former    Packs/day: 0.15    Types: Cigarettes   Smokeless tobacco: Never   Tobacco comments:    reports that she stopped June 2020  Vaping Use   Vaping Use: Never used  Substance and Sexual Activity   Alcohol use: Yes    Comment: every weekend   Drug use: Yes    Types: Marijuana   Sexual activity: Yes    Birth control/protection: None  Other Topics Concern   Not on file  Social History Narrative   Not on file   Social Determinants of Health   Financial Resource Strain: Not  on file  Food Insecurity: Not on file  Transportation Needs: Not on file  Physical Activity: Not on file  Stress: Not on file  Social Connections: Not on file  Intimate Partner Violence: Not on file    Family History  Problem Relation Age of Onset   COPD Mother    Hypertension Father     Current Facility-Administered Medications  Medication Dose Route Frequency Provider Last Rate Last Admin   0.9 % NaCl with KCl 20 mEq/ L  infusion   Intravenous Continuous Abigail Miyamoto, MD 100 mL/hr at 07/30/22 0746 New Bag at 07/30/22 0746   acetaminophen (TYLENOL) tablet 1,000 mg  1,000 mg Oral Q6H Abigail Miyamoto, MD   1,000 mg at 07/30/22 0746   [START ON 07/31/2022] cefTRIAXone (ROCEPHIN) 2 g in sodium chloride 0.9 % 100 mL IVPB  2 g Intravenous Q24H Abigail Miyamoto, MD       enoxaparin (LOVENOX) injection 40 mg  40 mg Subcutaneous Q24H Abigail Miyamoto, MD       HYDROmorphone (DILAUDID) injection 1 mg  1 mg Intravenous Q2H PRN Abigail Miyamoto, MD       ketorolac (TORADOL) 30 MG/ML injection 30 mg  30 mg Intravenous Q6H PRN Abigail Miyamoto, MD       ondansetron (ZOFRAN-ODT) disintegrating tablet 4 mg  4 mg Oral Q6H  PRN Abigail Miyamoto, MD       Or   ondansetron [facility]) injection 4 mg  4 mg Intravenous Q6H PRN Abigail Miyamoto, MD       No current outpatient medications on file.     No Known Allergies  BP (!) 105/55   Pulse (!) 58   Temp 98.1 F (36.7 C) (Oral)   Resp 17   Ht 5\' 5"  (1.651 m)   Wt 98.9 kg   LMP 07/22/2022   SpO2 98%   BMI 36.28 kg/m   07/24/2022 Abdomen Limited RUQ (LIVER/GB)  Result Date: 07/30/2022 CLINICAL DATA:  Right upper quadrant abdominal pain, nausea EXAM: ULTRASOUND ABDOMEN LIMITED RIGHT UPPER QUADRANT COMPARISON:  CT 10/06/2019 FINDINGS: Gallbladder: Multiple layering gallstones are seen within the gallbladder with a 19 mm gallstone seen impacted within the gallbladder neck on decubitus imaging (image # 57, cine images). The gallbladder is  distended. There is no gallbladder wall thickening, and no pericholecystic fluid is identified. Common bile duct: Diameter: Dilated, measuring 10 mm in proximal diameter Liver: No focal lesion identified. Within normal limits in parenchymal echogenicity. Portal vein is patent on color Doppler imaging with normal direction of blood flow towards the liver. Other: None. IMPRESSION: 1. Cholelithiasis with a 19 mm gallstone impacted within the gallbladder neck. 2. Dilated common bile duct measuring 10 mm. A distal obstructing lesion, such as choledocholithiasis, is not excluded. ERCP or MRCP examination is recommended for further evaluation. Electronically Signed   By: 10/08/2019 M.D.   On: 07/30/2022 00:18    Note:  This dictation was prepared with Dragon/digital dictation along with 08/01/2022. Any transcriptional errors that result from this process are unintentional.    [employer], MD, FACS, MASCRS Esophageal, Gastrointestinal & Colorectal Surgery Robotic and Minimally Invasive Surgery  [facility] [address] [place], [place] [phone] Fax [phone] Main  CONTACT INFORMATION:  Weekday (9AM-5PM): Call [facility] main office at [phone]  Weeknight (5PM-9AM) or Weekend/Holiday: Check [email or web address] (password " [email or web address]") for General Surgery [facility] coverage  (Please, do not use SecureChat as it is not reliable communication to reach operating surgeons for immediate patient care)      07/30/2022  8:04 AM

## 2022-07-30 NOTE — Anesthesia Preprocedure Evaluation (Addendum)
Anesthesia Evaluation  Patient identified by MRN, date of birth, ID band Patient awake    Reviewed: Allergy & Precautions, H&P , NPO status , Patient's Chart, lab work & pertinent test results, reviewed documented beta blocker date and time   Airway Mallampati: I  TM Distance: >3 FB Neck ROM: full    Dental no notable dental hx. (+) Teeth Intact, Poor Dentition, Dental Advisory Given, Caps   Pulmonary neg pulmonary ROS, former smoker,    Pulmonary exam normal breath sounds clear to auscultation       Cardiovascular Exercise Tolerance: Good negative cardio ROS   Rhythm:regular Rate:Normal     Neuro/Psych PSYCHIATRIC DISORDERS Anxiety Depression Bipolar Disorder negative neurological ROS     GI/Hepatic negative GI ROS, Neg liver ROS,   Endo/Other  negative endocrine ROS  Renal/GU negative Renal ROS  negative genitourinary   Musculoskeletal   Abdominal   Peds  Hematology negative hematology ROS (+)   Anesthesia Other Findings   Reproductive/Obstetrics negative OB ROS                            Anesthesia Physical Anesthesia Plan  ASA: 2  Anesthesia Plan: General   Post-op Pain Management: Minimal or no pain anticipated   Induction: Intravenous  PONV Risk Score and Plan: 3 and Ondansetron, Dexamethasone and Treatment may vary due to age or medical condition  Airway Management Planned: Oral ETT  Additional Equipment: None  Intra-op Plan:   Post-operative Plan: Extubation in OR  Informed Consent: I have reviewed the patients History and Physical, chart, labs and discussed the procedure including the risks, benefits and alternatives for the proposed anesthesia with the patient or authorized representative who has indicated his/her understanding and acceptance.     Dental Advisory Given  Plan Discussed with: CRNA and Anesthesiologist  Anesthesia Plan Comments: (  )         Anesthesia Quick Evaluation

## 2022-07-30 NOTE — Transfer of Care (Signed)
Immediate Anesthesia Transfer of Care Note  Patient: Angela [name]  Procedure(s) Performed: LAPAROSCOPIC CHOLECYSTECTOMY WITH INTRAOPERATIVE CHOLANGIOGRAM (Abdomen)  Patient Location: PACU  Anesthesia Type:General  Level of Consciousness: awake, alert , oriented and patient cooperative  Airway & Oxygen Therapy: Patient Spontanous Breathing and Patient connected to face mask oxygen  Post-op Assessment: Report given to RN and Post -op Vital signs reviewed and stable  Post vital signs: Reviewed and stable  Last Vitals:  Vitals Value Taken Time  BP 156/136 07/30/22 1230  Temp    Pulse 80 07/30/22 1229  Resp 18 07/30/22 1231  SpO2 100 % 07/30/22 1229  Vitals shown include unvalidated device data.  Last Pain:  Vitals:   07/30/22 1018  TempSrc:   PainSc: 3          Complications: No notable events documented.

## 2022-07-30 NOTE — Interval H&P Note (Signed)
History and Physical Interval Note:  07/30/2022 10:30 AM  Angela [name]  has presented today for surgery, with the diagnosis of Cholecysitis.  The various methods of treatment have been discussed with the patient and family. After consideration of risks, benefits and other options for treatment, the patient has consented to  Procedure(s): LAPAROSCOPIC CHOLECYSTECTOMY SINGLE SITE (N/A) as a surgical intervention.  The patient's history has been reviewed, patient examined, no change in status, stable for surgery.  I have reviewed the patient's chart and labs.  Questions were answered to the patient's satisfaction.    I have re-reviewed the the patient's records, history, medications, and allergies.  I have re-examined the patient.  I again discussed intraoperative plans and goals of post-operative recovery.  The patient agrees to proceed.  Angela [name]  [birth year] [id number]  Patient Care Team: Patient, No Pcp Per as PCP - General (General Practice)  Patient Active Problem List   Diagnosis Date Noted   Acute calculous cholecystitis 07/30/2022   Constipation, chronic 07/30/2022   Pelvic pain in female 04/14/2020   Menometrorrhagia 04/14/2020   Bipolar II disorder, severe, depressed, with anxious distress (HCC) 11/25/2018   Mild cannabis use disorder 11/25/2018   IUD complication (HCC) 03/22/2018   Positive urine drug screen 07/12/2014   Hx MRSA infection 03/02/2013   History of ADHD 03/02/2013   ADD (attention deficit disorder) 11/14/2012   Obesity 11/14/2012    Past Medical History:  Diagnosis Date   ADHD    Axillary abscess 11/14/2012   Bipolar 1 disorder (HCC)    Borderline personality disorder (HCC) 11/24/2018   Depression    GAD (generalized anxiety disorder) 05/18/2020   Suicidal ideation 11/24/2018    History reviewed. No pertinent surgical history.  Social History   Socioeconomic History   Marital status: Single    Spouse name: Not on file   Number of children: Not  on file   Years of education: Not on file   Highest education level: Not on file  Occupational History   Not on file  Tobacco Use   Smoking status: Former    Packs/day: 0.15    Types: Cigarettes   Smokeless tobacco: Never   Tobacco comments:    reports that she stopped June 2020  Vaping Use   Vaping Use: Never used  Substance and Sexual Activity   Alcohol use: Yes    Comment: every weekend   Drug use: Yes    Types: Marijuana   Sexual activity: Yes    Birth control/protection: None  Other Topics Concern   Not on file  Social History Narrative   Not on file   Social Determinants of Health   Financial Resource Strain: Not on file  Food Insecurity: Not on file  Transportation Needs: Not on file  Physical Activity: Not on file  Stress: Not on file  Social Connections: Not on file  Intimate Partner Violence: Not on file    Family History  Problem Relation Age of Onset   COPD Mother    Hypertension Father     No medications prior to admission.    Current Facility-Administered Medications  Medication Dose Route Frequency Provider Last Rate Last Admin   0.9 % NaCl with KCl 20 mEq/ L  infusion   Intravenous Continuous Abigail Miyamoto, MD   Paused at 07/30/22 0849   [MAR Hold] acetaminophen (TYLENOL) tablet 1,000 mg  1,000 mg Oral Q6H Abigail Miyamoto, MD   1,000 mg at 07/30/22 0746   acetaminophen (TYLENOL) tablet 1,000  mg  1,000 mg Oral On Call to OR Karie Soda, MD       bupivacaine liposome (EXPAREL) 1.3 % injection 266 mg  20 mL Infiltration Once Karie Soda, MD       [facility] Hold] cefTRIAXone (ROCEPHIN) 2 g in sodium chloride 0.9 % 100 mL IVPB  2 g Intravenous Q24H Abigail Miyamoto, MD       cefTRIAXone (ROCEPHIN) 2 g in sodium chloride 0.9 % 100 mL IVPB  2 g Intravenous On Call to OR Karie Soda, MD       Chlorhexidine Gluconate Cloth 2 % PADS 6 each  6 each Topical Once Karie Soda, MD       And   Chlorhexidine Gluconate Cloth 2 % PADS 6 each  6 each  Topical Once Karie Soda, MD       [facility] Hold] enoxaparin (LOVENOX) injection 40 mg  40 mg Subcutaneous Q24H Abigail Miyamoto, MD       Melene Muller ON 07/31/2022] feeding supplement (ENSURE PRE-SURGERY) liquid 296 mL  296 mL Oral Once Karie Soda, MD       [facility] Hold] HYDROmorphone (DILAUDID) injection 1 mg  1 mg Intravenous Q2H PRN Abigail Miyamoto, MD       [facility] Hold] ketorolac (TORADOL) 30 MG/ML injection 30 mg  30 mg Intravenous Q6H PRN Abigail Miyamoto, MD       lactated ringers infusion   Intravenous Continuous Karie Soda, MD       [facility] Hold] ondansetron (ZOFRAN-ODT) disintegrating tablet 4 mg  4 mg Oral Q6H PRN Abigail Miyamoto, MD       Or   Mitzi Hansen Hold] ondansetron [facility]) injection 4 mg  4 mg Intravenous Q6H PRN Abigail Miyamoto, MD         No Known Allergies  BP 118/63 (BP Location: Left Arm)   Pulse 70   Temp 98 F (36.7 C) (Oral)   Resp 18   Ht 5\' 5"  (1.651 m)   Wt 98.9 kg   LMP 07/22/2022 Comment: 07-29-21 POCT negative  SpO2 100%   BMI 36.28 kg/m   Labs: Results for orders placed or performed during the hospital encounter of 07/29/22 (from the past 48 hour(s))  Urinalysis, Routine w reflex microscopic Urine, Clean Catch     Status: None   Collection Time: 07/29/22 10:08 PM  Result Value Ref Range   Color, Urine YELLOW YELLOW   APPearance CLEAR CLEAR   Specific Gravity, Urine 1.010 1.005 - 1.030   pH 6.5 5.0 - 8.0   Glucose, UA NEGATIVE NEGATIVE mg/dL   Hgb urine dipstick NEGATIVE NEGATIVE   Bilirubin Urine NEGATIVE NEGATIVE   Ketones, ur NEGATIVE NEGATIVE mg/dL   Protein, ur NEGATIVE NEGATIVE mg/dL   Nitrite NEGATIVE NEGATIVE   Leukocytes,Ua NEGATIVE NEGATIVE    Comment: Microscopic not done on urines with negative protein, blood, leukocytes, nitrite, or glucose < 500 mg/dL. Performed at [facility], [address] [place], [place]   Pregnancy, urine     Status: None   Collection Time: 07/29/22 10:08 PM  Result Value Ref Range    Preg Test, Ur NEGATIVE NEGATIVE    Comment:        THE SENSITIVITY OF THIS METHODOLOGY IS >20 mIU/mL. Performed at [facility], [address] [place], [place]   CBC with Differential     Status: Abnormal   Collection Time: 07/29/22 10:21 PM  Result Value Ref Range   WBC 11.9 (H) 4.0 -  10.5 K/uL   RBC 4.19 3.87 - 5.11 MIL/uL   Hemoglobin 12.7 12.0 - 15.0 g/dL   HCT 52.8 41.3 - 24.4 %   MCV 89.0 80.0 - 100.0 fL   MCH 30.3 26.0 - 34.0 pg   MCHC 34.0 30.0 - 36.0 g/dL   RDW 01.0 27.2 - 53.6 %   Platelets 272 150 - 400 K/uL   nRBC 0.0 0.0 - 0.2 %   Neutrophils Relative % 80 %   Neutro Abs 9.6 (H) 1.7 - 7.7 K/uL   Lymphocytes Relative 12 %   Lymphs Abs 1.4 0.7 - 4.0 K/uL   Monocytes Relative 6 %   Monocytes Absolute 0.8 0.1 - 1.0 K/uL   Eosinophils Relative 1 %   Eosinophils Absolute 0.1 0.0 - 0.5 K/uL   Basophils Relative 0 %   Basophils Absolute 0.0 0.0 - 0.1 K/uL   Immature Granulocytes 1 %   Abs Immature Granulocytes 0.06 0.00 - 0.07 K/uL    Comment: Performed at [facility], [address] [place], [place] [address]  Lipase, blood     Status: None   Collection Time: 07/29/22 10:21 PM  Result Value Ref Range   Lipase 24 11 - 51 U/L    Comment: Performed at [facility], [address] [place], [place] [address]  Comprehensive metabolic panel     Status: Abnormal   Collection Time: 07/29/22 10:21 PM  Result Value Ref Range   Sodium 139 135 - 145 mmol/L   Potassium 3.7 3.5 - 5.1 mmol/L   Chloride 107 98 - 111 mmol/L   CO2 26 22 - 32 mmol/L   Glucose, Bld 107 (H) 70 - 99 mg/dL    Comment: Glucose reference range applies only to samples taken after fasting for at least 8 hours.   BUN 11 6 - 20 mg/dL   Creatinine, Ser 9.56 0.44 - 1.00 mg/dL   Calcium 8.7 (L) 8.9 - 10.3 mg/dL   Total Protein 6.9 6.5 - 8.1 g/dL   Albumin 3.7 3.5 - 5.0 g/dL   AST 387 (H) 15 - 41 U/L   ALT 85 (H) 0 - 44 U/L   Alkaline Phosphatase 96 38 - 126  U/L   Total Bilirubin 0.9 0.3 - 1.2 mg/dL   GFR, Estimated >56 >43 mL/min    Comment: (NOTE) Calculated using the CKD-EPI Creatinine Equation (2021)    Anion gap 6 5 - 15    Comment: Performed at [facility], [address] [place], [place] [address]    Imaging / Studies: US Abdomen Limited RUQ (LIVER/GB)  Result Date: 07/30/2022 CLINICAL DATA:  Right upper quadrant abdominal pain, nausea EXAM: ULTRASOUND ABDOMEN LIMITED RIGHT UPPER QUADRANT COMPARISON:  CT 10/06/2019 FINDINGS: Gallbladder: Multiple layering gallstones are seen within the gallbladder with a 19 mm gallstone seen impacted within the gallbladder neck on decubitus imaging (image # 57, cine images). The gallbladder is distended. There is no gallbladder wall thickening, and no pericholecystic fluid is identified. Common bile duct: Diameter: Dilated, measuring 10 mm in proximal diameter Liver: No focal lesion identified. Within normal limits in parenchymal echogenicity. Portal vein is patent on color Doppler imaging with normal direction of blood flow towards the liver. Other: None. IMPRESSION: 1. Cholelithiasis with a 19 mm gallstone impacted within the gallbladder neck. 2. Dilated common bile duct measuring 10 mm. A distal obstructing lesion, such as choledocholithiasis, is not excluded. ERCP or MRCP examination is recommended for  further evaluation. Electronically Signed   By: Helyn Numbers M.D.   On: 07/30/2022 00:18     .Ardeth Sportsman, M.D., F.A.C.S. Gastrointestinal and Minimally Invasive Surgery [facility] [address] [place], [place] [address] [phone] Main / Paging  07/30/2022 10:30 AM    Ardeth Sportsman

## 2022-07-30 NOTE — Discharge Instructions (Signed)
################################################################  LAPAROSCOPIC SURGERY: POST OP INSTRUCTIONS  ######################################################################  EAT Gradually transition to a high fiber diet with a fiber supplement over the next few weeks after discharge.  Start with a pureed / full liquid diet (see below)  WALK Walk an hour a day.  Control your pain to do that.    CONTROL PAIN Control pain so that you can walk, sleep, tolerate sneezing/coughing, go up/down stairs.  HAVE A BOWEL MOVEMENT DAILY Keep your bowels regular to avoid problems.  OK to try a laxative to override constipation.  OK to use an antidairrheal to slow down diarrhea.  Call if not better after 2 tries  CALL IF YOU HAVE PROBLEMS/CONCERNS Call if you are still struggling despite following these instructions. Call if you have concerns not answered by these instructions  ######################################################################    DIET: Follow a light bland diet & liquids the first 24 hours after arrival home, such as soup, liquids, starches, etc.  Be sure to drink plenty of fluids.  Quickly advance to a usual solid diet within a few days.  Avoid fast food or heavy meals as your are more likely to get nauseated or have irregular bowels.  A low-fat, high-fiber diet for the rest of your life is ideal.  Take your usually prescribed home medications unless otherwise directed.  PAIN CONTROL: Pain is best controlled by a usual combination of three different methods TOGETHER: Ice/Heat Over the counter pain medication Prescription pain medication Most patients will experience some swelling and bruising around the incisions.  Ice packs or heating pads (30-60 minutes up to 6 times a day) will help. Use ice for the first few days to help decrease swelling and bruising, then switch to heat to help relax tight/sore spots and speed recovery.  Some people prefer to use ice alone, heat  alone, alternating between ice & heat.  Experiment to what works for you.  Swelling and bruising can take several weeks to resolve.   It is helpful to take an over-the-counter pain medication regularly for the first few weeks.  Choose one of the following that works best for you: Naproxen (Aleve, etc)  Two 220mg  tabs twice a day Ibuprofen (Advil, etc) Three 200mg  tabs four times a day (every meal & bedtime) Acetaminophen (Tylenol, etc) 500-650mg  four times a day (every meal & bedtime) A  prescription for pain medication (such as oxycodone, hydrocodone, tramadol, gabapentin, methocarbamol, etc) should be given to you upon discharge.  Take your pain medication as prescribed.  If you are having problems/concerns with the prescription medicine (does not control pain, nausea, vomiting, rash, itching, etc), please call us [phone] to see if we need to switch you to a different pain medicine that will work better for you and/or control your side effect better. If you need a refill on your pain medication, please give [place] 48 hour notice.  contact your pharmacy.  They will contact our office to request authorization. Prescriptions will not be filled after 5 pm or on week-ends  Avoid getting constipated.   Between the surgery and the pain medications, it is common to experience some constipation.   Increasing fluid intake and taking a fiber supplement (such as Metamucil, Citrucel, FiberCon, MiraLax, etc) 1-2 times a day regularly will usually help prevent this problem from occurring.   A mild laxative (prune juice, Milk of Magnesia, MiraLax, etc) should be taken according to package directions if there are no bowel movements after 48 hours.   Watch out for diarrhea.  If you have many loose bowel movements, simplify your diet to bland foods & liquids for a few days.   Stop any stool softeners and decrease your fiber supplement.   Switching to mild anti-diarrheal medications (Kayopectate, Pepto Bismol) can  help.   If this worsens or does not improve, please call us.  Wash / shower every day.  You may shower over the dressings as they are waterproof.  Continue to shower over incision(s) after the dressing is off.  It is good for closed incisions and even open wounds to be washed every day.  Shower every day.  Short baths are fine.  Wash the incisions and wounds clean with soap & water.    You may leave closed incisions open to air if it is dry.   You may cover the incision with clean gauze & replace it after your daily shower for comfort.  TEGADERM:  You have clear gauze band-aid dressings over your closed incision(s).  Remove the dressings 3 days after surgery.    ACTIVITIES as tolerated:   You may resume regular (light) daily activities beginning the next day--such as daily self-care, walking, climbing stairs--gradually increasing activities as tolerated.  If you can walk 30 minutes without difficulty, it is safe to try more intense activity such as jogging, treadmill, bicycling, low-impact aerobics, swimming, etc. Save the most intensive and strenuous activity for last such as sit-ups, heavy lifting, contact sports, etc  Refrain from any heavy lifting or straining until you are off narcotics for pain control.  Nothing greater than 15 pounds for 3 weeks from surgery DO NOT PUSH THROUGH PAIN.  Let pain be your guide: If it hurts to do something, don't do it.  Pain is your body warning you to avoid that activity for another week until the pain goes down. You may drive when you are no longer taking prescription pain medication, you can comfortably wear a seatbelt, and you can safely maneuver your car and apply brakes. You may have sexual intercourse when it is comfortable.  FOLLOW UP in our office Please call [facility] at [phone] to set up an appointment to see your surgeon in the office for a follow-up appointment approximately 2-3 weeks after your surgery. Make sure that you call for this  appointment the day you arrive home to insure a convenient appointment time.  10. IF YOU HAVE DISABILITY OR FAMILY LEAVE FORMS, BRING THEM TO THE OFFICE FOR PROCESSING.  DO NOT GIVE THEM TO YOUR DOCTOR.   WHEN TO CALL us [phone]: Poor pain control Reactions / problems with new medications (rash/itching, nausea, etc)  Fever over 101.5 F (38.5 C) Inability to urinate Nausea and/or vomiting Worsening swelling or bruising Continued bleeding from incision. Increased pain, redness, or drainage from the incision   The clinic staff is available to answer your questions during regular business hours (8:30am-5pm).  Please don't hesitate to call and ask to speak to one of our nurses for clinical concerns.   If you have a medical emergency, go to the nearest emergency room or call 911.  A surgeon from [facility] is always on call at the [facility], [place] [address], [address], [place], [place]  [address] ? MAIN: [phone] ? TOLL FREE: [phone] ?  FAX [phone] [email or web address]  ##############################################################

## 2022-07-30 NOTE — H&P (Addendum)
Angela [name]  [birth year] [id number]  CARE TEAM:  PCP: Patient, No Pcp Per  Outpatient Care Team: Patient Care Team: Patient, No Pcp Per as PCP - General (General Practice)  Inpatient Treatment Team: Treatment Team: Attending Provider: Montez Morita, Md, MD   This patient is a 31 y.o.female who presents today for surgical evaluation at the request of Dr Madilyn Hook, [facility] ED.   Chief complaint / Reason for evaluation: Vomiting up abdominal pain.  Probable cholecystitis.  Elderly woman with history of anxiety, bipolar, questionable borderline personality disorder.  Occasional cannabis use.  Has had intermittent attacks of upper abdominal pain and nausea and vomiting.  Mainly started last week when she was on vacation down to [facility].  Tried numerous over-the-counter pain and antiacid medications with only partial relief.  Usually last a few hours.  However had a more severe attack.  Worsening vomiting.  Concern.  Came to [facility].  History physical suspicious for biliary colic/cholecystitis.  Ultrasound showing impacted large gallstone in the infundibulum.  Given persistent pain, concern for cholecystitis.  Surgical consultation evaluated.  Challenged with bed availability but eventually able to get patient transferred to [facility] long in the morning short stay for evaluation.  Patient does have chronic constipation, moving her bowels once or twice a week.  Can walk 20 minutes without much difficulty.  Gets a little winded going up a few flights of stairs.  No asthma.  Occasionally smokes marijuana but no tobacco.  No liver or pancreas issues.  No prior surgeries.  She does note her family does have a history of some gallbladder and bowel problems.   Assessment  Angela [name]  31 y.o. female  Day of Surgery  Procedure(s): LAPAROSCOPIC CHOLECYSTECTOMY SINGLE SITE  Problem List:  Principal Problem:   Acute calculous cholecystitis Active Problems:   ADD (attention deficit disorder)    Bipolar II disorder, severe, depressed, with anxious distress (HCC)   Mild cannabis use disorder   Obesity   History of ADHD   Constipation, chronic  Assessment  Recurrent biliary colic now with constant pain.  Exam very suspicious for acute calculus cholecystitis  [facility] Stay = 0 days)  Plan:  IV fluids  Nausea and pain control.  IV antibiotics.  Laparoscopically cholecystectomy with cholangiogram.  Possible liver biopsy.  The anatomy & physiology of hepatobiliary & pancreatic function was discussed.  The pathophysiology of gallbladder dysfunction was discussed.  Natural history risks without surgery was discussed.   I feel the risks of no intervention will lead to serious problems that outweigh the operative risks; therefore, I recommended cholecystectomy to remove the pathology.  I explained laparoscopic techniques with possible need for an open approach.  Probable cholangiogram to evaluate the bilary tract was explained as well.    Risks such as bleeding, infection, diarrhea and other bowel changes, abscess, leak, injury to other organs, need for repair of tissues / organs, need for further treatment, stroke, heart attack, death, and other risks were discussed.  I noted a good likelihood this will help address the problem, but there is a chance it may not help.  Possibility that this will not correct all abdominal symptoms was explained.  Goals of post-operative recovery were discussed as well.  We will work to minimize complications.  An educational handout further explaining the pathology and treatment options was given as well.  Questions were answered.  The patient expresses understanding & wishes to proceed with surgery.  -VTE prophylaxis- SCDs, etc  -mobilize  as tolerated to help recovery  -Chronic constipation.  Recommend regular fiber bowel regimen.  Can sort this out in the future.  -ADD, Bipolar d/o -seems relatively stable.    I reviewed nursing notes, ED  provider notes, last 24 h vitals and pain scores, last 48 h intake and output, last 24 h labs and trends, and last 24 h imaging results. I have reviewed this patient's available data, including medical history, events of note, test results, etc as part of my evaluation.  A significant portion of that time was spent in counseling.  Care during the described time interval was provided by me.  This care required moderate level of medical decision making.  07/30/2022   Adin Hector, MD, FACS, MASCRS Esophageal, Gastrointestinal & Colorectal Surgery Robotic and Minimally Invasive Surgery  [facility] [place] [id number] [place], [place] [address] [phone] Fax [phone] Main  CONTACT INFORMATION:  Weekday (9AM-5PM): Call [facility] main office at [phone]  Weeknight (5PM-9AM) or Weekend/Holiday: Check [email or web address] (password " [email or web address]") for General Surgery [facility] coverage  (Please, do not use SecureChat as it is not reliable communication to reach operating surgeons for immediate patient care)      07/30/2022      Past Medical History:  Diagnosis Date   ADHD    Axillary abscess 11/14/2012   Bipolar 1 disorder ([place])    Borderline personality disorder ([place]) 11/24/2018   Depression    GAD (generalized anxiety disorder) 05/18/2020   Suicidal ideation 11/24/2018    History reviewed. No pertinent surgical history.  Social History   Socioeconomic History   Marital status: Single    Spouse name: Not on file   Number of children: Not on file   Years of education: Not on file   Highest education level: Not on file  Occupational History   Not on file  Tobacco Use   Smoking status: Former    Packs/day: 0.15    Types: Cigarettes   Smokeless tobacco: Never   Tobacco comments:    reports that she stopped June 2020  Vaping Use   Vaping Use: Never used  Substance and Sexual Activity   Alcohol use: Yes    Comment:  every weekend   Drug use: Yes    Types: Marijuana   Sexual activity: Yes    Birth control/protection: None  Other Topics Concern   Not on file  Social History Narrative   Not on file   Social Determinants of Health   Financial Resource Strain: Not on file  Food Insecurity: Not on file  Transportation Needs: Not on file  Physical Activity: Not on file  Stress: Not on file  Social Connections: Not on file  Intimate Partner Violence: Not on file    Family History  Problem Relation Age of Onset   COPD Mother    Hypertension Father     Current Facility-Administered Medications  Medication Dose Route Frequency Provider Last Rate Last Admin   0.9 % NaCl with KCl 20 mEq/ L  infusion   Intravenous Continuous Coralie Keens, MD   Paused at 07/30/22 0849   [MAR Hold] acetaminophen (TYLENOL) tablet 1,000 mg  1,000 mg Oral Q6H Coralie Keens, MD   1,000 mg at 07/30/22 0746   acetaminophen (TYLENOL) tablet 1,000 mg  1,000 mg Oral On Call to OR Michael Boston, MD       bupivacaine liposome (EXPAREL) 1.3 % injection 266 mg  20 mL Infiltration Once  Michael Boston, MD       [facility] Hold] cefTRIAXone (ROCEPHIN) 2 g in sodium chloride 0.9 % 100 mL IVPB  2 g Intravenous Q24H Coralie Keens, MD       cefTRIAXone (ROCEPHIN) 2 g in sodium chloride 0.9 % 100 mL IVPB  2 g Intravenous On Call to OR Michael Boston, MD       Chlorhexidine Gluconate Cloth 2 % PADS 6 each  6 each Topical Once Michael Boston, MD       And   Chlorhexidine Gluconate Cloth 2 % PADS 6 each  6 each Topical Once Michael Boston, MD       [facility] Hold] enoxaparin (LOVENOX) injection 40 mg  40 mg Subcutaneous Q24H Coralie Keens, MD       Derrill Memo ON 07/31/2022] feeding supplement (ENSURE PRE-SURGERY) liquid 296 mL  296 mL Oral Once Michael Boston, MD       [facility] Hold] HYDROmorphone (DILAUDID) injection 1 mg  1 mg Intravenous Q2H PRN Coralie Keens, MD       [facility] Hold] ketorolac (TORADOL) 30 MG/ML injection 30 mg  30 mg Intravenous  Q6H PRN Coralie Keens, MD       lactated ringers infusion   Intravenous Continuous Michael Boston, MD       [facility] Hold] ondansetron (ZOFRAN-ODT) disintegrating tablet 4 mg  4 mg Oral Q6H PRN Coralie Keens, MD       Or   [facility] Hold] ondansetron [facility]) injection 4 mg  4 mg Intravenous Q6H PRN Coralie Keens, MD         No Known Allergies  ROS:   All other systems reviewed & are negative except per HPI or as noted below: Constitutional:  No fevers, chills, sweats.  Weight stable Eyes:  No vision changes, No discharge HENT:  No sore throats, nasal drainage Lymph: No neck swelling, No bruising easily Pulmonary:  No cough, productive sputum CV: No orthopnea, PND  Patient walks 30 minutes without difficulty.  No exertional chest/neck/shoulder/arm pain.  GI:  No personal nor family history of GI/colon cancer, inflammatory bowel disease, irritable bowel syndrome, allergy such as Celiac Sprue, dietary/dairy problems, colitis, ulcers nor gastritis.  No recent sick contacts/gastroenteritis.  No travel outside the country.  No changes in diet.  Renal: No UTIs, No hematuria Genital:  No drainage, bleeding, masses Musculoskeletal: No severe joint pain.  Good ROM major joints Skin:  No sores or lesions Heme/Lymph:  No easy bleeding.  No swollen lymph nodes   BP 118/63 (BP Location: Left Arm)   Pulse 70   Temp 98 F (36.7 C) (Oral)   Resp 18   Ht 5\' 5"  (1.651 m)   Wt 98.9 kg   LMP 07/22/2022 Comment: 07-29-21 POCT negative  SpO2 100%   BMI 36.28 kg/m   Physical Exam:  Constitutional: Not cachectic.  Hygeine adequate.  Vitals signs as above.   Eyes: Pupils reactive, normal extraocular movements. Sclera nonicteric Neuro: CN II-XII intact.  No major focal sensory defects.  No major motor deficits. Lymph: No head/neck/groin lymphadenopathy Psych:  No severe agitation.  No severe anxiety.  Judgment & insight Adequate, Oriented x4, mildly impulsive (interrupting often, decided to call  a friend and sing happy birthday in the middle of my conversation) HENT: Normocephalic, Mucus membranes moist.  No thrush.   Neck: Supple, No tracheal deviation.  No obvious thyromegaly Chest: No pain to chest wall compression.  Good respiratory excursion.  No audible wheezing CV:  Pulses intact.  regular rhythm.  No major extremity edema  Abdomen: Supraumbilical piercing intact with good hygiene.  Obese Hernia: Not present. Diastasis recti: Not present. Soft.   Nondistended.  Tenderness at right upper quadrant to deep palpation.  Mild Murphy sign.  Mild epigastric discomfort.  Left upper quadrant and rest of abdomen nontender. .  No hepatomegaly.  No splenomegaly  Gen:  Inguinal hernia: Not present.  Inguinal lymph nodes: without lymphadenopathy.    Rectal: (Deferred)  Ext: No obvious deformity or contracture.  Edema: Not present.  No cyanosis Skin: No major subcutaneous nodules.  Warm and dry Musculoskeletal: Severe joint rigidity not present.  No obvious clubbing.  No digital petechiae.     Results:   Labs: Results for orders placed or performed during the hospital encounter of 07/29/22 (from the past 48 hour(s))  Urinalysis, Routine w reflex microscopic Urine, Clean Catch     Status: None   Collection Time: 07/29/22 10:08 PM  Result Value Ref Range   Color, Urine YELLOW YELLOW   APPearance CLEAR CLEAR   Specific Gravity, Urine 1.010 1.005 - 1.030   pH 6.5 5.0 - 8.0   Glucose, UA NEGATIVE NEGATIVE mg/dL   Hgb urine dipstick NEGATIVE NEGATIVE   Bilirubin Urine NEGATIVE NEGATIVE   Ketones, ur NEGATIVE NEGATIVE mg/dL   Protein, ur NEGATIVE NEGATIVE mg/dL   Nitrite NEGATIVE NEGATIVE   Leukocytes,Ua NEGATIVE NEGATIVE    Comment: Microscopic not done on urines with negative protein, blood, leukocytes, nitrite, or glucose < 500 mg/dL. Performed at [facility], [address] [place], [place] [address]   Pregnancy, urine     Status: None   Collection Time: 07/29/22  10:08 PM  Result Value Ref Range   Preg Test, Ur NEGATIVE NEGATIVE    Comment:        THE SENSITIVITY OF THIS METHODOLOGY IS >20 mIU/mL. Performed at [facility], [address] [place], [place] [address]   CBC with Differential     Status: Abnormal   Collection Time: 07/29/22 10:21 PM  Result Value Ref Range   WBC 11.9 (H) 4.0 - 10.5 K/uL   RBC 4.19 3.87 - 5.11 MIL/uL   Hemoglobin 12.7 12.0 - 15.0 g/dL   HCT 37.3 36.0 - 46.0 %   MCV 89.0 80.0 - 100.0 fL   MCH 30.3 26.0 - 34.0 pg   MCHC 34.0 30.0 - 36.0 g/dL   RDW 13.9 11.5 - 15.5 %   Platelets 272 150 - 400 K/uL   nRBC 0.0 0.0 - 0.2 %   Neutrophils Relative % 80 %   Neutro Abs 9.6 (H) 1.7 - 7.7 K/uL   Lymphocytes Relative 12 %   Lymphs Abs 1.4 0.7 - 4.0 K/uL   Monocytes Relative 6 %   Monocytes Absolute 0.8 0.1 - 1.0 K/uL   Eosinophils Relative 1 %   Eosinophils Absolute 0.1 0.0 - 0.5 K/uL   Basophils Relative 0 %   Basophils Absolute 0.0 0.0 - 0.1 K/uL   Immature Granulocytes 1 %   Abs Immature Granulocytes 0.06 0.00 - 0.07 K/uL    Comment: Performed at [facility], [address] [place], [place] [address]  Lipase, blood     Status: None   Collection Time: 07/29/22 10:21 PM  Result Value Ref Range   Lipase 24 11 - 51 U/L    Comment: Performed at [facility], [address] [place], [place] [address]  Comprehensive metabolic panel  Status: Abnormal   Collection Time: 07/29/22 10:21 PM  Result Value Ref Range   Sodium 139 135 - 145 mmol/L   Potassium 3.7 3.5 - 5.1 mmol/L   Chloride 107 98 - 111 mmol/L   CO2 26 22 - 32 mmol/L   Glucose, Bld 107 (H) 70 - 99 mg/dL    Comment: Glucose reference range applies only to samples taken after fasting for at least 8 hours.   BUN 11 6 - 20 mg/dL   Creatinine, Ser 0.73 0.44 - 1.00 mg/dL   Calcium 8.7 (L) 8.9 - 10.3 mg/dL   Total Protein 6.9 6.5 - 8.1 g/dL   Albumin 3.7 3.5 - 5.0 g/dL   AST 123 (H) 15 - 41 U/L   ALT 85 (H) 0 - 44 U/L    Alkaline Phosphatase 96 38 - 126 U/L   Total Bilirubin 0.9 0.3 - 1.2 mg/dL   GFR, Estimated >60 >60 mL/min    Comment: (NOTE) Calculated using the CKD-EPI Creatinine Equation (2021)    Anion gap 6 5 - 15    Comment: Performed at [facility], [address] [place], [place] [address]    Imaging / Studies: US Abdomen Limited RUQ (LIVER/GB)  Result Date: 07/30/2022 CLINICAL DATA:  Right upper quadrant abdominal pain, nausea EXAM: ULTRASOUND ABDOMEN LIMITED RIGHT UPPER QUADRANT COMPARISON:  CT 10/06/2019 FINDINGS: Gallbladder: Multiple layering gallstones are seen within the gallbladder with a 19 mm gallstone seen impacted within the gallbladder neck on decubitus imaging (image # [id number], cine images). The gallbladder is distended. There is no gallbladder wall thickening, and no pericholecystic fluid is identified. Common bile duct: Diameter: Dilated, measuring 10 mm in proximal diameter Liver: No focal lesion identified. Within normal limits in parenchymal echogenicity. Portal vein is patent on color Doppler imaging with normal direction of blood flow towards the liver. Other: None. IMPRESSION: 1. Cholelithiasis with a 19 mm gallstone impacted within the gallbladder neck. 2. Dilated common bile duct measuring 10 mm. A distal obstructing lesion, such as choledocholithiasis, is not excluded. ERCP or MRCP examination is recommended for further evaluation. Electronically Signed   By: Fidela Salisbury M.D.   On: 07/30/2022 00:18    Medications / Allergies: per chart  Antibiotics: Anti-infectives (From admission, onward)    Start     Dose/Rate Route Frequency Ordered Stop   07/31/22 0500  [MAR Hold]  cefTRIAXone (ROCEPHIN) 2 g in sodium chloride 0.9 % 100 mL IVPB        (MAR Hold since Tue 07/30/2022 at 0929.Hold Reason: Transfer to a Procedural area)   2 g 200 mL/hr over 30 Minutes Intravenous Every 24 hours 07/30/22 0734 08/07/22 0459   07/30/22 0945  cefTRIAXone (ROCEPHIN) 2 g in sodium  chloride 0.9 % 100 mL IVPB        2 g 200 mL/hr over 30 Minutes Intravenous On call to O.R. 07/30/22 0932 07/31/22 0559   07/30/22 0045  cefTRIAXone (ROCEPHIN) 2 g in sodium chloride 0.9 % 100 mL IVPB        2 g 200 mL/hr over 30 Minutes Intravenous  Once 07/30/22 0039 07/30/22 0300         Note: Portions of this report may have been transcribed using voice recognition software. Every effort was made to ensure accuracy; however, inadvertent computerized transcription errors may be present.   Any transcriptional errors that result from this process are unintentional.    Adin Hector, MD, FACS, MASCRS Esophageal, Gastrointestinal & Colorectal  Surgery Robotic and Minimally Invasive Surgery  [facility] [address], [address] [place], [place] [address] [phone] Fax [phone] Main  CONTACT INFORMATION:  Weekday (9AM-5PM): Call [facility] main office at [phone]  Weeknight (5PM-9AM) or Weekend/Holiday: Check [email or web address] (password " [email or web address]") for General Surgery [facility] coverage  (Please, do not use SecureChat as it is not reliable communication to reach operating surgeons for immediate patient care)       07/30/2022  10:27 AM

## 2022-07-30 NOTE — ED Notes (Signed)
[facility] at bedside for transport. 

## 2022-07-30 NOTE — H&P (View-Only) (Signed)
Reason for Consult: Choledocholithiasis Referring Physician: CCS  [name] HPI: This is a 31 year old female with a PMH of Bipolar D/O, anxiety, and Borderline personality D/O admitted for symptomatic cholelithiasis.  Her symptoms started when she was on vacation at [facility].  She suffered with upper abdominal pain, nausea, and vomiting.  Over-the-counter medications were ineffective or any length of time.  Upon her return from vacation she suffered with a severe attack of her pain.  The prompted her to present to the ER for further evaluation and treatment.  Surgery evaluated her and there was concern for an acute calculus cholecystitis.  Operatively she was diagnosed with an acute on chronic calculus cholecystitis, fatty liver, and choledocholithiasis.  The IOC showed that she had a nonobstructing distal CBD stone.  Her liver enzymes were mildly elevated yesterday:  AST 123 and ALT 85.  The AP and TB were normal.  Past Medical History:  Diagnosis Date   ADHD    Axillary abscess 11/14/2012   Bipolar 1 disorder (HCC)    Borderline personality disorder (HCC) 11/24/2018   Depression    GAD (generalized anxiety disorder) 05/18/2020   Suicidal ideation 11/24/2018    History reviewed. No pertinent surgical history.  Family History  Problem Relation Age of Onset   COPD Mother    Hypertension Father     Social History:  reports that she has quit smoking. Her smoking use included cigarettes. She smoked an average of .15 packs per day. She has never used smokeless tobacco. She reports current alcohol use. She reports current drug use. Drug: Marijuana.  Allergies: No Known Allergies  Medications: Scheduled:  acetaminophen  1,000 mg Oral Q6H   [START ON 07/31/2022] enoxaparin (LOVENOX) injection  40 mg Subcutaneous Q24H   fentaNYL       gabapentin  300 mg Oral BID   Continuous:  sodium chloride     0.9 % NaCl with KCl 20 mEq / L Stopped (07/30/22 0849)   [START ON 07/31/2022]  cefTRIAXone (ROCEPHIN)  IV     lactated ringers 50 mL/hr at 07/30/22 1339   lactated ringers      Results for orders placed or performed during the hospital encounter of 07/29/22 (from the past 24 hour(s))  Urinalysis, Routine w reflex microscopic Urine, Clean Catch     Status: None   Collection Time: 07/29/22 10:08 PM  Result Value Ref Range   Color, Urine YELLOW YELLOW   APPearance CLEAR CLEAR   Specific Gravity, Urine 1.010 1.005 - 1.030   pH 6.5 5.0 - 8.0   Glucose, UA NEGATIVE NEGATIVE mg/dL   Hgb urine dipstick NEGATIVE NEGATIVE   Bilirubin Urine NEGATIVE NEGATIVE   Ketones, ur NEGATIVE NEGATIVE mg/dL   Protein, ur NEGATIVE NEGATIVE mg/dL   Nitrite NEGATIVE NEGATIVE   Leukocytes,Ua NEGATIVE NEGATIVE  Pregnancy, urine     Status: None   Collection Time: 07/29/22 10:08 PM  Result Value Ref Range   Preg Test, Ur NEGATIVE NEGATIVE  CBC with Differential     Status: Abnormal   Collection Time: 07/29/22 10:21 PM  Result Value Ref Range   WBC 11.9 (H) 4.0 - 10.5 K/uL   RBC 4.19 3.87 - 5.11 MIL/uL   Hemoglobin 12.7 12.0 - 15.0 g/dL   HCT 78.9 38.1 - 01.7 %   MCV 89.0 80.0 - 100.0 fL   MCH 30.3 26.0 - 34.0 pg   MCHC 34.0 30.0 - 36.0 g/dL   RDW 51.0 25.8 - 52.7 %  Platelets 272 150 - 400 K/uL   nRBC 0.0 0.0 - 0.2 %   Neutrophils Relative % 80 %   Neutro Abs 9.6 (H) 1.7 - 7.7 K/uL   Lymphocytes Relative 12 %   Lymphs Abs 1.4 0.7 - 4.0 K/uL   Monocytes Relative 6 %   Monocytes Absolute 0.8 0.1 - 1.0 K/uL   Eosinophils Relative 1 %   Eosinophils Absolute 0.1 0.0 - 0.5 K/uL   Basophils Relative 0 %   Basophils Absolute 0.0 0.0 - 0.1 K/uL   Immature Granulocytes 1 %   Abs Immature Granulocytes 0.06 0.00 - 0.07 K/uL  Lipase, blood     Status: None   Collection Time: 07/29/22 10:21 PM  Result Value Ref Range   Lipase 24 11 - 51 U/L  Comprehensive metabolic panel     Status: Abnormal   Collection Time: 07/29/22 10:21 PM  Result Value Ref Range   Sodium 139 135 - 145  mmol/L   Potassium 3.7 3.5 - 5.1 mmol/L   Chloride 107 98 - 111 mmol/L   CO2 26 22 - 32 mmol/L   Glucose, Bld 107 (H) 70 - 99 mg/dL   BUN 11 6 - 20 mg/dL   Creatinine, Ser 9.92 0.44 - 1.00 mg/dL   Calcium 8.7 (L) 8.9 - 10.3 mg/dL   Total Protein 6.9 6.5 - 8.1 g/dL   Albumin 3.7 3.5 - 5.0 g/dL   AST 426 (H) 15 - 41 U/L   ALT 85 (H) 0 - 44 U/L   Alkaline Phosphatase 96 38 - 126 U/L   Total Bilirubin 0.9 0.3 - 1.2 mg/dL   GFR, Estimated >83 >41 mL/min   Anion gap 6 5 - 15  HIV Antibody (routine testing w rflx)     Status: None   Collection Time: 07/30/22  7:55 AM  Result Value Ref Range   HIV Screen 4th Generation wRfx Non Reactive Non Reactive     DG Cholangiogram Operative  Result Date: 07/30/2022 CLINICAL DATA:  Cholelithiasis. EXAM: INTRAOPERATIVE CHOLANGIOGRAM TECHNIQUE: Cholangiographic images from the C-arm fluoroscopic device were submitted for interpretation post-operatively. Please see the procedural report for the amount of contrast and the fluoroscopy time utilized. COMPARISON:  Ultrasound 07/29/2022 FINDINGS: Contrast opacification of the intrahepatic and extrahepatic biliary system. Mild dilatation of the common bile duct with drainage into the duodenum. There appears to be small filling defects in the distal duodenum suggestive for small nonobstructive stones. IMPRESSION: Patent biliary system with concern for small nonobstructive stones in the distal common bile duct. Mild dilatation of the common bile duct. Electronically Signed   By: Richarda Overlie M.D.   On: 07/30/2022 12:34   US Abdomen Limited RUQ (LIVER/GB)  Result Date: 07/30/2022 CLINICAL DATA:  Right upper quadrant abdominal pain, nausea EXAM: ULTRASOUND ABDOMEN LIMITED RIGHT UPPER QUADRANT COMPARISON:  CT 10/06/2019 FINDINGS: Gallbladder: Multiple layering gallstones are seen within the gallbladder with a 19 mm gallstone seen impacted within the gallbladder neck on decubitus imaging (image # [id number], cine images). The  gallbladder is distended. There is no gallbladder wall thickening, and no pericholecystic fluid is identified. Common bile duct: Diameter: Dilated, measuring 10 mm in proximal diameter Liver: No focal lesion identified. Within normal limits in parenchymal echogenicity. Portal vein is patent on color Doppler imaging with normal direction of blood flow towards the liver. Other: None. IMPRESSION: 1. Cholelithiasis with a 19 mm gallstone impacted within the gallbladder neck. 2. Dilated common bile duct measuring 10 mm. A distal obstructing lesion,  such as choledocholithiasis, is not excluded. ERCP or MRCP examination is recommended for further evaluation. Electronically Signed   By: Ashesh  Parikh M.D.   On: 07/30/2022 00:18    ROS:  As stated above in the HPI otherwise negative.  Blood pressure 122/69, pulse 71, temperature 98.2 F (36.8 C), temperature source Oral, resp. rate 16, height 5' 5" (1.651 m), weight 98.9 kg, last menstrual period 07/22/2022, SpO2 100 %.    PE: Gen: NAD, Alert and Oriented, fearful, anxious, and histrionic Lungs: CTA Bilaterally CV: RRR without M/G/R ABD: Soft, tender at the incision sites, +BS Ext: No C/C/E  Assessment/Plan: 1) Choledocholithiasis. 2) S/p lap chole. 3) Severe anxiety. 4) Histrionic.   A brief description of the procedure was described to the patient and a family member, who was on the phone (? Mother).  No further details about the procedure were described to the patient as she was very histrionic at the mention of another procedure.  She repeatedly stated that she was scarred about any procedures.  The family member agreed with the procedure and felt it necessary to proceed.  Plan: 1) ERCP tomorrow.  Dynastie Knoop D 07/30/2022, 3:10 PM      

## 2022-07-30 NOTE — ED Provider Notes (Signed)
Patient care assumed at 2345.  Patient here for evaluation of right  upper quadrant abdominal pain.  Care assumed pending however quadrant ultrasound.  CBC with mild leukocytosis, mild elevation in transaminases.  Right upper quadrant ultrasound with cholelithiasis and stone in the gallbladder neck.  Patient with persistent pain on repeat evaluation.  Discussed with patient findings of studies.  Recommend admission for ongoing management.  We will treat with antibiotics for possible early cholecystitis.  Discussed with Dr. Magnus Ivan with general surgery, agrees to see the patient for ongoing care.   Tilden Fossa, MD 07/30/22 [phone]

## 2022-07-30 NOTE — Anesthesia Postprocedure Evaluation (Signed)
Anesthesia Post Note  Patient: Angela [name]  Procedure(s) Performed: LAPAROSCOPIC CHOLECYSTECTOMY WITH INTRAOPERATIVE CHOLANGIOGRAM (Abdomen)     Patient location during evaluation: PACU Anesthesia Type: General Level of consciousness: awake and alert Pain management: pain level controlled Vital Signs Assessment: post-procedure vital signs reviewed and stable Respiratory status: spontaneous breathing, nonlabored ventilation, respiratory function stable and patient connected to nasal cannula oxygen Cardiovascular status: blood pressure returned to baseline and stable Postop Assessment: no apparent nausea or vomiting Anesthetic complications: no   No notable events documented.  Last Vitals:  Vitals:   07/30/22 1245 07/30/22 1300  BP: 105/86 (!) 139/91  Pulse: 67 65  Resp: 15 15  Temp:    SpO2: 100% 100%    Last Pain:  Vitals:   07/30/22 1300  TempSrc:   PainSc: Asleep                 Jeslin Bazinet

## 2022-07-30 NOTE — ED Notes (Addendum)
Patient given chlorhexidine wipes and toothbrush/toothpaste prior to transport per shortstay. Placed in hospital gown and belongings placed in belongings bag

## 2022-07-30 NOTE — Op Note (Signed)
07/30/2022  PATIENT:  Angela [name]  31 y.o. female  Patient Care Team: Patient, No Pcp Per as PCP - General (General Practice)  PRE-OPERATIVE DIAGNOSIS:    Acute on Chronic Calculus Cholecystitis  POST-OPERATIVE DIAGNOSIS:   Acute on Chronic Calculus Cholecystitis Choledocolithiasis, nonobstructing Liver: Fatty steatohepatitis  PROCEDURE:  Laparoscopic cholecystectomy with intraoperative cholangiogram (CPT code 84132)  SURGEON:  Ardeth Sportsman, MD, FACS.  ASSISTANT: OR Staff   ANESTHESIA:    General with endotracheal intubation Local anesthetic as a field block  EBL:  (See Anesthesia Intraoperative Record) Total I/O In: 1000 [I.V.:900; IV Piggyback:100] Out: 20 [Blood:20]   Delay start of Pharmacological VTE agent (>24hrs) due to surgical blood loss or risk of bleeding:  no  DRAINS: None   SPECIMEN: Gallbladder    DISPOSITION OF SPECIMEN:  PATHOLOGY  COUNTS:  YES  PLAN OF CARE: Admit to inpatient   PATIENT DISPOSITION:  PACU - hemodynamically stable.  INDICATION: Young woman's intermittent episodes of biliary colic now with constant pain and nausea and vomiting.  Murphy sign.  Concern for cholecystitis.  Recommendation made for urgent cholecystectomy and cholangiogram  The anatomy & physiology of hepatobiliary & pancreatic function was discussed.  The pathophysiology of gallbladder dysfunction was discussed.  Natural history risks without surgery was discussed.   I feel the risks of no intervention will lead to serious problems that outweigh the operative risks; therefore, I recommended cholecystectomy to remove the pathology.  I explained laparoscopic techniques with possible need for an open approach.  Probable cholangiogram to evaluate the bilary tract was explained as well.    Risks such as bleeding, infection, abscess, leak, injury to other organs, need for further treatment, heart attack, death, and other risks were discussed.  I noted a good likelihood this  will help address the problem.  Possibility that this will not correct all abdominal symptoms was explained.  Goals of post-operative recovery were discussed as well.  We will work to minimize complications.  An educational handout further explaining the pathology and treatment options was given as well.  Questions were answered.  The patient expresses understanding & wishes to proceed with surgery.  OR FINDINGS: Chronically thickened gallbladder consistent with chronic cholecystitis.  Some edema suspicious for early acute cholecystitis as well.  Cholangiogram notes classic biliary anatomy but persistent distal common bile duct intraluminal spherical masses suspicious for nonobstructive choledocholithiasis.  Discussed with Dr. Richarda Overlie with radiology who suspects that as well.  Liver: Fatty steatohepatitis  DESCRIPTION:   Informed consent was confirmed.  The patient underwent general anaesthesia without difficulty.  The patient was positioned appropriately.  VTE prevention in place.  The patient's abdomen was clipped, prepped, & draped in a sterile fashion.  Surgical timeout confirmed our plan.  Peritoneal entry with a laparoscopic port was obtained using optical entry technique in the right upper abdomen as the patient was positioned in reverse Trendelenburg.  Entry was clean.  I induced carbon dioxide insufflation.  Camera inspection revealed no injury.  Extra ports were carefully placed under direct laparoscopic visualization.  I turned attention to the right upper quadrant.  The gallbladder fundus was elevated cephalad.  I used hook cautery to free the peritoneal coverings between the gallbladder and the liver on the posteriolateral and anteriomedial walls.   I used careful blunt and hook dissection to help get a good critical view of the cystic artery and cystic duct. I did further dissection to free 80%of the gallbladder off the liver bed to get a  good critical view of the infundibulum and  cystic duct.  I dissected out the cystic artery; and, after getting a good 360 view, ligated the anterior & posterior branches of the cystic artery close on the infundibulum using the Harmonic ultrasonic dissection.  I skeletonized the cystic duct.  I placed a clip on the infundibulum. I did a partial cystic duct-otomy and ensured patency. I placed a 5 [place] cholangiocatheter through a puncture site at the right subcostal ridge of the abdominal wall and directed it into the cystic duct.  We ran a cholangiogram with dilute radio-opaque contrast and continuous fluoroscopy. Contrast flowed from a side branch consistent with cystic duct cannulization. Contrast flowed up the common hepatic duct into the right and left intrahepatic chains out to secondary radicals. Contrast flowed down the common bile duct easily across the normal ampulla into the duodenum.  However there were mobile spherical hyperlucency's that were concerning for nonobstructive choledocholithiasis.  Flushed with saline & repeated  - persistent.  I removed the cholangiocatheter.  I freed the gallbladder from its remaining attachments on the liver bed.  I used a 0 PDS Endoloop loop to ligate the cystic duct since it was edematous.  Placed clips on the cystic duct x4.   I completed cystic duct transection. I freed the gallbladder from its remaining attachments to the liver. I ensured hemostasis on the gallbladder fossa of the liver and elsewhere. I inspected the rest of the abdomen & detected no injury nor bleeding elsewhere.  I placed the gallbladder inside and EcoSac & removed the gallbladder. I closed the extraction port site subxiphoid fascia transversely using #1 PDS interrupted stitches. I closed the skin using 4-0 monocryl stitch.  Sterile dressings were applied. The patient was extubated & arrived in the PACU in stable condition.  I called and reviewed the cholangiogram films with Dr. Richarda Overlie with radiology and he to suspected common  bile duct stones but agree they were nonobstructing.  I made a call to consult gastroenterology but their office is closed right now for lunch.  We will call again  I made an attempt to locate & reach the desired patient contact (pt's husband) to discuss the patient's overall status and my recommendations.  No one is available at this time.  My plans & instructions have been written in the chart.   Ardeth Sportsman, M.D., F.A.C.S. Gastrointestinal and Minimally Invasive Surgery [facility] [address] [place], [place] [address] [phone] Main / Paging  07/30/2022 12:46 PM

## 2022-07-30 NOTE — Consult Note (Signed)
Reason for Consult: Choledocholithiasis Referring Physician: CCS  [name] HPI: This is a 31 year old female with a PMH of Bipolar D/O, anxiety, and Borderline personality D/O admitted for symptomatic cholelithiasis.  Her symptoms started when she was on vacation at [facility].  She suffered with upper abdominal pain, nausea, and vomiting.  Over-the-counter medications were ineffective or any length of time.  Upon her return from vacation she suffered with a severe attack of her pain.  The prompted her to present to the ER for further evaluation and treatment.  Surgery evaluated her and there was concern for an acute calculus cholecystitis.  Operatively she was diagnosed with an acute on chronic calculus cholecystitis, fatty liver, and choledocholithiasis.  The IOC showed that she had a nonobstructing distal CBD stone.  Her liver enzymes were mildly elevated yesterday:  AST 123 and ALT 85.  The AP and TB were normal.  Past Medical History:  Diagnosis Date   ADHD    Axillary abscess 11/14/2012   Bipolar 1 disorder (HCC)    Borderline personality disorder (HCC) 11/24/2018   Depression    GAD (generalized anxiety disorder) 05/18/2020   Suicidal ideation 11/24/2018    History reviewed. No pertinent surgical history.  Family History  Problem Relation Age of Onset   COPD Mother    Hypertension Father     Social History:  reports that she has quit smoking. Her smoking use included cigarettes. She smoked an average of .15 packs per day. She has never used smokeless tobacco. She reports current alcohol use. She reports current drug use. Drug: Marijuana.  Allergies: No Known Allergies  Medications: Scheduled:  acetaminophen  1,000 mg Oral Q6H   [START ON 07/31/2022] enoxaparin (LOVENOX) injection  40 mg Subcutaneous Q24H   fentaNYL       gabapentin  300 mg Oral BID   Continuous:  sodium chloride     0.9 % NaCl with KCl 20 mEq / L Stopped (07/30/22 0849)   [START ON 07/31/2022]  cefTRIAXone (ROCEPHIN)  IV     lactated ringers 50 mL/hr at 07/30/22 1339   lactated ringers      Results for orders placed or performed during the hospital encounter of 07/29/22 (from the past 24 hour(s))  Urinalysis, Routine w reflex microscopic Urine, Clean Catch     Status: None   Collection Time: 07/29/22 10:08 PM  Result Value Ref Range   Color, Urine YELLOW YELLOW   APPearance CLEAR CLEAR   Specific Gravity, Urine 1.010 1.005 - 1.030   pH 6.5 5.0 - 8.0   Glucose, UA NEGATIVE NEGATIVE mg/dL   Hgb urine dipstick NEGATIVE NEGATIVE   Bilirubin Urine NEGATIVE NEGATIVE   Ketones, ur NEGATIVE NEGATIVE mg/dL   Protein, ur NEGATIVE NEGATIVE mg/dL   Nitrite NEGATIVE NEGATIVE   Leukocytes,Ua NEGATIVE NEGATIVE  Pregnancy, urine     Status: None   Collection Time: 07/29/22 10:08 PM  Result Value Ref Range   Preg Test, Ur NEGATIVE NEGATIVE  CBC with Differential     Status: Abnormal   Collection Time: 07/29/22 10:21 PM  Result Value Ref Range   WBC 11.9 (H) 4.0 - 10.5 K/uL   RBC 4.19 3.87 - 5.11 MIL/uL   Hemoglobin 12.7 12.0 - 15.0 g/dL   HCT 78.9 38.1 - 01.7 %   MCV 89.0 80.0 - 100.0 fL   MCH 30.3 26.0 - 34.0 pg   MCHC 34.0 30.0 - 36.0 g/dL   RDW 51.0 25.8 - 52.7 %  Platelets 272 150 - 400 K/uL   nRBC 0.0 0.0 - 0.2 %   Neutrophils Relative % 80 %   Neutro Abs 9.6 (H) 1.7 - 7.7 K/uL   Lymphocytes Relative 12 %   Lymphs Abs 1.4 0.7 - 4.0 K/uL   Monocytes Relative 6 %   Monocytes Absolute 0.8 0.1 - 1.0 K/uL   Eosinophils Relative 1 %   Eosinophils Absolute 0.1 0.0 - 0.5 K/uL   Basophils Relative 0 %   Basophils Absolute 0.0 0.0 - 0.1 K/uL   Immature Granulocytes 1 %   Abs Immature Granulocytes 0.06 0.00 - 0.07 K/uL  Lipase, blood     Status: None   Collection Time: 07/29/22 10:21 PM  Result Value Ref Range   Lipase 24 11 - 51 U/L  Comprehensive metabolic panel     Status: Abnormal   Collection Time: 07/29/22 10:21 PM  Result Value Ref Range   Sodium 139 135 - 145  mmol/L   Potassium 3.7 3.5 - 5.1 mmol/L   Chloride 107 98 - 111 mmol/L   CO2 26 22 - 32 mmol/L   Glucose, Bld 107 (H) 70 - 99 mg/dL   BUN 11 6 - 20 mg/dL   Creatinine, Ser 9.92 0.44 - 1.00 mg/dL   Calcium 8.7 (L) 8.9 - 10.3 mg/dL   Total Protein 6.9 6.5 - 8.1 g/dL   Albumin 3.7 3.5 - 5.0 g/dL   AST 426 (H) 15 - 41 U/L   ALT 85 (H) 0 - 44 U/L   Alkaline Phosphatase 96 38 - 126 U/L   Total Bilirubin 0.9 0.3 - 1.2 mg/dL   GFR, Estimated >83 >41 mL/min   Anion gap 6 5 - 15  HIV Antibody (routine testing w rflx)     Status: None   Collection Time: 07/30/22  7:55 AM  Result Value Ref Range   HIV Screen 4th Generation wRfx Non Reactive Non Reactive     DG Cholangiogram Operative  Result Date: 07/30/2022 CLINICAL DATA:  Cholelithiasis. EXAM: INTRAOPERATIVE CHOLANGIOGRAM TECHNIQUE: Cholangiographic images from the C-arm fluoroscopic device were submitted for interpretation post-operatively. Please see the procedural report for the amount of contrast and the fluoroscopy time utilized. COMPARISON:  Ultrasound 07/29/2022 FINDINGS: Contrast opacification of the intrahepatic and extrahepatic biliary system. Mild dilatation of the common bile duct with drainage into the duodenum. There appears to be small filling defects in the distal duodenum suggestive for small nonobstructive stones. IMPRESSION: Patent biliary system with concern for small nonobstructive stones in the distal common bile duct. Mild dilatation of the common bile duct. Electronically Signed   By: Richarda Overlie M.D.   On: 07/30/2022 12:34   US Abdomen Limited RUQ (LIVER/GB)  Result Date: 07/30/2022 CLINICAL DATA:  Right upper quadrant abdominal pain, nausea EXAM: ULTRASOUND ABDOMEN LIMITED RIGHT UPPER QUADRANT COMPARISON:  CT 10/06/2019 FINDINGS: Gallbladder: Multiple layering gallstones are seen within the gallbladder with a 19 mm gallstone seen impacted within the gallbladder neck on decubitus imaging (image # 57, cine images). The  gallbladder is distended. There is no gallbladder wall thickening, and no pericholecystic fluid is identified. Common bile duct: Diameter: Dilated, measuring 10 mm in proximal diameter Liver: No focal lesion identified. Within normal limits in parenchymal echogenicity. Portal vein is patent on color Doppler imaging with normal direction of blood flow towards the liver. Other: None. IMPRESSION: 1. Cholelithiasis with a 19 mm gallstone impacted within the gallbladder neck. 2. Dilated common bile duct measuring 10 mm. A distal obstructing lesion,  such as choledocholithiasis, is not excluded. ERCP or MRCP examination is recommended for further evaluation. Electronically Signed   By: Helyn Numbers M.D.   On: 07/30/2022 00:18    ROS:  As stated above in the HPI otherwise negative.  Blood pressure 122/69, pulse 71, temperature 98.2 F (36.8 C), temperature source Oral, resp. rate 16, height 5\' 5"  (1.651 m), weight 98.9 kg, last menstrual period 07/22/2022, SpO2 100 %.    PE: Gen: NAD, Alert and Oriented, fearful, anxious, and histrionic Lungs: CTA Bilaterally CV: RRR without M/G/R ABD: Soft, tender at the incision sites, +BS Ext: No C/C/E  Assessment/Plan: 1) Choledocholithiasis. 2) S/p lap chole. 3) Severe anxiety. 4) Histrionic.   A brief description of the procedure was described to the patient and a family member, who was on the phone (? Mother).  No further details about the procedure were described to the patient as she was very histrionic at the mention of another procedure.  She repeatedly stated that she was scarred about any procedures.  The family member agreed with the procedure and felt it necessary to proceed.  Plan: 1) ERCP tomorrow.  Satia Winger D 07/30/2022, 3:10 PM

## 2022-07-31 ENCOUNTER — Observation Stay (HOSPITAL_COMMUNITY): Payer: Self-pay

## 2022-07-31 ENCOUNTER — Observation Stay (HOSPITAL_COMMUNITY): Payer: Self-pay | Admitting: Certified Registered Nurse Anesthetist

## 2022-07-31 ENCOUNTER — Encounter (HOSPITAL_COMMUNITY): Payer: Self-pay | Admitting: Surgery

## 2022-07-31 ENCOUNTER — Encounter (HOSPITAL_COMMUNITY): Admission: EM | Disposition: A | Discharge status: Home / Self Care | Payer: Self-pay | Source: Home / Self Care

## 2022-07-31 DIAGNOSIS — K8050 Calculus of bile duct without cholangitis or cholecystitis without obstruction: Secondary | ICD-10-CM

## 2022-07-31 HISTORY — PX: REMOVAL OF STONES: SHX5545

## 2022-07-31 HISTORY — PX: ERCP: SHX5425

## 2022-07-31 HISTORY — PX: SPHINCTEROTOMY: SHX5544

## 2022-07-31 LAB — CBC
HCT: 36.8 % (ref 36.0–46.0)
Hemoglobin: 12.2 g/dL (ref 12.0–15.0)
MCH: 30.4 pg (ref 26.0–34.0)
MCHC: 33.2 g/dL (ref 30.0–36.0)
MCV: 91.8 fL (ref 80.0–100.0)
Platelets: 257 10*3/uL (ref 150–400)
RBC: 4.01 MIL/uL (ref 3.87–5.11)
RDW: 13.8 % (ref 11.5–15.5)
WBC: 13.8 10*3/uL — ABNORMAL HIGH (ref 4.0–10.5)
nRBC: 0 % (ref 0.0–0.2)

## 2022-07-31 LAB — COMPREHENSIVE METABOLIC PANEL
ALT: 92 U/L — ABNORMAL HIGH (ref 0–44)
AST: 45 U/L — ABNORMAL HIGH (ref 15–41)
Albumin: 3.5 g/dL (ref 3.5–5.0)
Alkaline Phosphatase: 88 U/L (ref 38–126)
Anion gap: 8 (ref 5–15)
BUN: 6 mg/dL (ref 6–20)
CO2: 23 mmol/L (ref 22–32)
Calcium: 8.8 mg/dL — ABNORMAL LOW (ref 8.9–10.3)
Chloride: 111 mmol/L (ref 98–111)
Creatinine, Ser: 0.75 mg/dL (ref 0.44–1.00)
GFR, Estimated: >60 mL/min (ref >60)
Glucose, Bld: 111 mg/dL — ABNORMAL HIGH (ref 70–99)
Potassium: 3.8 mmol/L (ref 3.5–5.1)
Sodium: 142 mmol/L (ref 135–145)
Total Bilirubin: 0.3 mg/dL (ref 0.3–1.2)
Total Protein: 6.6 g/dL (ref 6.5–8.1)

## 2022-07-31 LAB — LIPASE, BLOOD: Lipase: 22 U/L (ref 11–51)

## 2022-07-31 LAB — SURGICAL PATHOLOGY

## 2022-07-31 SURGERY — ERCP, WITH INTERVENTION IF INDICATED
Anesthesia: General

## 2022-07-31 MED ORDER — SODIUM CHLORIDE 0.9 % IV SOLN
INTRAVENOUS | Status: DC
Start: 2022-07-31 — End: 2022-07-31

## 2022-07-31 MED ORDER — PROPOFOL 10 MG/ML IV BOLUS
INTRAVENOUS | Status: DC | PRN
  Administered 2022-07-31: 150 mg via INTRAVENOUS
  Administered 2022-07-31: 50 mg via INTRAVENOUS

## 2022-07-31 MED ORDER — SUGAMMADEX SODIUM 200 MG/2ML IV SOLN
INTRAVENOUS | Status: DC | PRN
  Administered 2022-07-31: 200 mg via INTRAVENOUS

## 2022-07-31 MED ORDER — LACTATED RINGERS IV SOLN
INTRAVENOUS | Status: AC | PRN
  Administered 2022-07-31: 1000 mL via INTRAVENOUS

## 2022-07-31 MED ORDER — GLUCAGON HCL RDNA (DIAGNOSTIC) 1 MG IJ SOLR
INTRAMUSCULAR | Status: AC
  Filled 2022-07-31: qty 1

## 2022-07-31 MED ORDER — PROPOFOL 10 MG/ML IV BOLUS
INTRAVENOUS | Status: AC
  Filled 2022-07-31: qty 20

## 2022-07-31 MED ORDER — SODIUM CHLORIDE 0.9 % IV SOLN
INTRAVENOUS | Status: DC | PRN
  Administered 2022-07-31: 25 mL

## 2022-07-31 MED ORDER — INDOMETHACIN 50 MG RE SUPP
RECTAL | Status: AC
  Filled 2022-07-31: qty 2

## 2022-07-31 MED ORDER — LIDOCAINE 2% (20 MG/ML) 5 ML SYRINGE
INTRAMUSCULAR | Status: DC | PRN
  Administered 2022-07-31: 80 mg via INTRAVENOUS

## 2022-07-31 MED ORDER — DEXAMETHASONE SODIUM PHOSPHATE 10 MG/ML IJ SOLN
INTRAMUSCULAR | Status: DC | PRN
  Administered 2022-07-31: 8 mg via INTRAVENOUS

## 2022-07-31 MED ORDER — ROCURONIUM BROMIDE 10 MG/ML (PF) SYRINGE
PREFILLED_SYRINGE | INTRAVENOUS | Status: DC | PRN
  Administered 2022-07-31: 50 mg via INTRAVENOUS

## 2022-07-31 MED ORDER — PHENYLEPHRINE 80 MCG/ML (10ML) SYRINGE FOR IV PUSH (FOR BLOOD PRESSURE SUPPORT)
PREFILLED_SYRINGE | INTRAVENOUS | Status: DC | PRN
  Administered 2022-07-31 (×2): 80 ug via INTRAVENOUS

## 2022-07-31 MED ORDER — INDOMETHACIN 50 MG RE SUPP
RECTAL | Status: DC | PRN
  Administered 2022-07-31: 100 mg via RECTAL

## 2022-07-31 NOTE — Interval H&P Note (Signed)
History and Physical Interval Note:  07/31/2022 1:03 PM  Angela [name]  has presented today for surgery, with the diagnosis of Choledocholithiasis.  The various methods of treatment have been discussed with the patient and family. After consideration of risks, benefits and other options for treatment, the patient has consented to  Procedure(s): ENDOSCOPIC RETROGRADE CHOLANGIOPANCREATOGRAPHY (ERCP) (N/A) as a surgical intervention.  The patient's history has been reviewed, patient examined, no change in status, stable for surgery.  I have reviewed the patient's chart and labs.  Questions were answered to the patient's satisfaction.     [name]

## 2022-07-31 NOTE — Progress Notes (Signed)
Chaplain engaged in an initial visit with Lonnie and her father.  Chaplain provided reflective listening and grief support to Dad as he processed the loss of his sister, which he learned by phone.  Chaplain provided space for them both to candidly share their feelings, fears, heartache, and memories.  Father discussed his own faith story and theology of salvation.    Chaplain provided prayer over them both.      07/31/22 1400  Clinical Encounter Type  Visited With Patient and family together  Visit Type Initial;Spiritual support  Referral From Nurse  Consult/Referral To Chaplain  Spiritual Encounters  Spiritual Needs Emotional;Grief support  Stress Factors  Family Stress Factors Major life changes;Loss of control;Family relationships

## 2022-07-31 NOTE — Op Note (Signed)
[facility] Patient Name: Angela [name] Procedure Date: 07/31/2022 MRN: [id number] Attending MD: Jeani Hawking , MD Date of Birth: May 08, 1991 CSN: [id number] Age: 31 Admit Type: Inpatient Procedure:                ERCP Indications:              Common bile duct stone(s) Providers:                Jeani Hawking, MD, Zoe Lan, RN Referring MD:              Medicines:                General Anesthesia Complications:            No immediate complications. Estimated Blood Loss:     Estimated blood loss was minimal. Procedure:                Pre-Anesthesia Assessment:                           - Prior to the procedure, a History and Physical                            was performed, and patient medications and                            allergies were reviewed. The patient's tolerance of                            previous anesthesia was also reviewed. The risks                            and benefits of the procedure and the sedation                            options and risks were discussed with the patient.                            All questions were answered, and informed consent                            was obtained. Prior Anticoagulants: The patient has                            taken no previous anticoagulant or antiplatelet                            agents. ASA Grade Assessment: II - A patient with                            mild systemic disease. After reviewing the risks                            and benefits, the patient was deemed in  satisfactory condition to undergo the procedure.                           - Sedation was administered by an anesthesia                            professional. General anesthesia was attained.                           After obtaining informed consent, the scope was                            passed under direct vision. Throughout the                            procedure, the patient's blood  pressure, pulse, and                            oxygen saturations were monitored continuously. The                            [id number]-Q190V ([id number]) Olympus duodenoscope was                            introduced through the mouth, and used to inject                            contrast into and used to inject contrast into the                            bile duct and dorsal pancreatic duct. The ERCP was                            accomplished without difficulty. The patient                            tolerated the procedure well. Scope In: Scope Out: Findings:      The major papilla was normal. A short 0.035 inch Soft Jagwire was passed       into the biliary tree. A 10 mm biliary sphincterotomy was made with a       traction (standard) sphincterotome using ERBE electrocautery. There was       self limited oozing from the sphincterotomy which did not require       treatment. The biliary tree was swept with a 12 mm balloon starting at       the bifurcation. Three stones were removed. No stones remained.      During the first two attempts the guidewire entered the PD. After the       second time the wire was left in the PD and a two wire technique was       used. This allowed successful access of the CBD. The guidewire was       secured in the right intrahepatic ducts. Contrast injection showed a       single distal CBD filling defect and the CBD measured approximately  1       cm. A sphincterotomy was created and the CBD was swept 4-5 times.       Approximately 3 small stone fragments were removed. The final occlusion       cholangiogram was negative for any retained stones. Impression:               - The major papilla appeared normal.                           - Choledocholithiasis was found. Complete removal                            was accomplished by biliary sphincterotomy and                            balloon extraction.                           - A biliary sphincterotomy was  performed.                           - The biliary tree was swept. Moderate Sedation:      Not Applicable - Patient had care per Anesthesia. Recommendation:           - Return patient to hospital ward for ongoing care.                           - Resume regular diet.                           - Routine post operative care. Procedure Code(s):        --- Professional ---                           [id number], Endoscopic retrograde                            cholangiopancreatography (ERCP); with removal of                            calculi/debris from biliary/pancreatic duct(s)                           43262, Endoscopic retrograde                            cholangiopancreatography (ERCP); with                            sphincterotomy/papillotomy Diagnosis Code(s):        --- Professional ---                           K80.50, Calculus of bile duct without cholangitis                            or cholecystitis without obstruction CPT copyright 2019 American Medical Association. All rights reserved.  The codes documented in this report are preliminary and upon coder review may  be revised to meet current compliance requirements. Jeani Hawking, MD Jeani Hawking, MD 07/31/2022 1:51:05 PM This report has been signed electronically. Number of Addenda: 0

## 2022-07-31 NOTE — Anesthesia Procedure Notes (Signed)
Procedure Name: Intubation Date/Time: 07/31/2022 1:15 PM  Performed by: Ezekiel Ina, CRNAPre-anesthesia Checklist: Patient identified, Emergency Drugs available, Suction available and Patient being monitored Patient Re-evaluated:Patient Re-evaluated prior to induction Oxygen Delivery Method: Circle system utilized Preoxygenation: Pre-oxygenation with 100% oxygen Induction Type: IV induction Ventilation: Mask ventilation without difficulty Laryngoscope Size: Miller and 2 Grade View: Grade II Tube type: Oral Tube size: 7.0 mm Number of attempts: 1 Airway Equipment and Method: Stylet Placement Confirmation: ETT inserted through vocal cords under direct vision, positive ETCO2 and breath sounds checked- equal and bilateral Secured at: 21 cm Tube secured with: Tape Dental Injury: Teeth and Oropharynx as per pre-operative assessment

## 2022-07-31 NOTE — Transfer of Care (Signed)
Immediate Anesthesia Transfer of Care Note  Patient: Angela [name]  Procedure(s) Performed: ENDOSCOPIC RETROGRADE CHOLANGIOPANCREATOGRAPHY (ERCP) SPHINCTEROTOMY REMOVAL OF STONES  Patient Location: Endoscopy Unit  Anesthesia Type:General  Level of Consciousness: drowsy  Airway & Oxygen Therapy: Patient connected to nasal cannula oxygen  Post-op Assessment: Report given to RN and Post -op Vital signs reviewed and stable  Post vital signs: Reviewed and stable  Last Vitals:  Vitals Value Taken Time  BP    Temp    Pulse 88 07/31/22 1357  Resp 23 07/31/22 1357  SpO2 100 % 07/31/22 1357  Vitals shown include unvalidated device data.  Last Pain:  Vitals:   07/31/22 1229  TempSrc: Temporal  PainSc: 8       Patients Stated Pain Goal: 2 (07/31/22 1229)  Complications: No notable events documented.

## 2022-07-31 NOTE — Anesthesia Preprocedure Evaluation (Signed)
Anesthesia Evaluation  Patient identified by MRN, date of birth, ID band Patient awake    Reviewed: Allergy & Precautions, NPO status , Patient's Chart, lab work & pertinent test results  Airway Mallampati: II  TM Distance: >3 FB Neck ROM: Full    Dental  (+) Dental Advisory Given, Poor Dentition, Chipped, Caps   Pulmonary neg pulmonary ROS, former smoker,    Pulmonary exam normal breath sounds clear to auscultation       Cardiovascular negative cardio ROS Normal cardiovascular exam Rhythm:Regular Rate:Normal     Neuro/Psych PSYCHIATRIC DISORDERS Anxiety Depression Bipolar Disorder negative neurological ROS     GI/Hepatic negative GI ROS, Choledocholithiasis   Endo/Other  Obesity   Renal/GU negative Renal ROS     Musculoskeletal negative musculoskeletal ROS (+)   Abdominal   Peds  (+) ADHD Hematology negative hematology ROS (+)   Anesthesia Other Findings Day of surgery medications reviewed with the patient.  Reproductive/Obstetrics                             Anesthesia Physical Anesthesia Plan  ASA: 2  Anesthesia Plan: General   Post-op Pain Management: Minimal or no pain anticipated   Induction: Intravenous  PONV Risk Score and Plan: 3 and Dexamethasone, Ondansetron and Midazolam  Airway Management Planned: Oral ETT  Additional Equipment:   Intra-op Plan:   Post-operative Plan: Extubation in OR  Informed Consent: I have reviewed the patients History and Physical, chart, labs and discussed the procedure including the risks, benefits and alternatives for the proposed anesthesia with the patient or authorized representative who has indicated his/her understanding and acceptance.     Dental advisory given  Plan Discussed with: CRNA  Anesthesia Plan Comments:         Anesthesia Quick Evaluation

## 2022-08-01 DIAGNOSIS — K819 Cholecystitis, unspecified: Secondary | ICD-10-CM | POA: Diagnosis present

## 2022-08-01 MED ORDER — TRAMADOL HCL 50 MG PO TABS: 50.0000 mg | ORAL_TABLET | Freq: Four times a day (QID) | ORAL | 0 refills | Status: AC | PRN

## 2022-08-01 MED ORDER — IBUPROFEN 400 MG PO TABS: 400.0000 mg | ORAL_TABLET | Freq: Four times a day (QID) | ORAL | 1 refills | Status: AC | PRN

## 2022-08-01 MED ORDER — METHOCARBAMOL 750 MG PO TABS: 750.0000 mg | ORAL_TABLET | Freq: Four times a day (QID) | ORAL | 0 refills | Status: AC | PRN

## 2022-08-01 NOTE — Progress Notes (Signed)
Attempted to follow up with Angela [name] and her dad after the loss of her aunt.  Angela [name] was sleeping and there was no family present.  As she is leaving today, chaplain will not attempt another follow up.  Please page if needs arise before Angela [name] discharges.  [address], Bcc Pager, [phone]

## 2022-08-01 NOTE — Discharge Summary (Signed)
[facility] Discharge Summary   Patient ID: Angela [name] MRN: [id number] DOB/AGE: December 29, 1990 31 y.o.  Admit date: 07/29/2022 Discharge date: 08/01/2022  Admitting Diagnosis: Cholecystitis with cholelithiasis [K80.10] Calculus of gallbladder and bile duct with obstruction without cholecystitis [K80.71] Acute cholecystitis due to biliary calculus [K80.00] Cholecystitis [K81.9]   Discharge Diagnosis Cholecystitis with cholelithiasis [K80.10] Calculus of gallbladder and bile duct with obstruction without cholecystitis [K80.71] choledoclolithiasis Acute cholecystitis due to biliary calculus [K80.00] Cholecystitis [K81.9] S/p Laparoscopic cholecystectomy with intraoperative cholangiogram S/p ERCP  Consultants Gastroenterology - Dr. Elnoria Howard  Imaging: DG ERCP  Result Date: 07/31/2022 CLINICAL DATA:  Possible choledocholithiasis on intra op cholangiogram EXAM: ERCP TECHNIQUE: Multiple spot images obtained with the fluoroscopic device and submitted for interpretation post-procedure. COMPARISON:  07/30/2022 FINDINGS: A series of fluoroscopic spot images document endoscopic cannulation of the pancreatic duct and subsequently the CBD, with opacification of the CBD, and passage of balloon catheter. The intrahepatic ducts are incompletely opacified, appearing decompressed centrally. Cholecystectomy clips. No extravasation identified. IMPRESSION: Endoscopic CBD cannulation and intervention as above. These images were submitted for radiologic interpretation only. Please see the procedural report for the amount of contrast and the fluoroscopy time utilized. Electronically Signed   By: Corlis Leak M.D.   On: 07/31/2022 15:28    Procedures Dr. Michaell Cowing (07/30/22) - Laparoscopic Cholecystectomy with IOC Dr. Elnoria Howard (07/31/22) - ERCP with sphincterotomy  Hospital Course:  31 year old female who presented to [facility] ED with with pain and emesis.  Workup showed cholecystitis.  Patient was admitted and  underwent procedure listed above. IOC was positive for choledocholithiasis.  Tolerated procedure well and was transferred to the floor.  GI was consulted for choledocholithiasis and ERCP with sphincterotomy was performed on 8/23 by Dr. Elnoria Howard. Diet was advanced as tolerated.  On POD3, the patient was voiding well, tolerating diet, ambulating well, pain well controlled, vital signs stable, incisions c/d/i and felt stable for discharge home.  Patient will follow up in our office in 3 weeks and knows to call with questions or concerns.     Physical Exam: General:  Alert, NAD, pleasant, comfortable Abd:  Soft, ND, mild tenderness, incisions C/D/I  I or a member of my team have reviewed this patient in the Controlled Substance Database  Allergies as of 08/01/2022   No Known Allergies      Medication List     TAKE these medications    GOODY HEADACHE PO Take 1 packet by mouth 3 (three) times daily as needed (pain/headache).   ibuprofen 400 MG tablet Commonly known as: ADVIL Take 1-2 tablets (400-800 mg total) by mouth every 6 (six) hours as needed for headache, fever, mild pain, moderate pain or cramping (pain). What changed:  medication strength how much to take when to take this reasons to take this   methocarbamol 750 MG tablet Commonly known as: Robaxin-750 Take 1 tablet (750 mg total) by mouth every 6 (six) hours as needed for up to 7 days for muscle spasms.   traMADol 50 MG tablet Commonly known as: ULTRAM Take 1-2 tablets (50-100 mg total) by mouth every 6 (six) hours as needed for moderate pain or severe pain.               Discharge Care Instructions  (From admission, onward)           Start     Ordered   07/30/22 0000  Discharge wound care:       Comments: It is good for closed incisions  and even open wounds to be washed every day.  Shower every day.  Short baths are fine.  Wash the incisions and wounds clean with soap & water.    You may leave closed incisions  open to air if it is dry.   You may cover the incision with clean gauze & replace it after your daily shower for comfort.  TEGADERM:  You have clear gauze band-aid dressings over your closed incision(s).  Remove the dressings 3 days after surgery.   07/30/22 1221              Follow-up Information     [facility] Schedule an appointment as soon as possible for a visit in 3 week(s).   Specialty: General Surgery Why: To follow up after your operation, To follow up after your hospital stay Contact information: [address] [place] [address] [phone]        [facility] DSS. Call.   Why: Call DSS to see if you are eligible for SNAP benefits (food stamps) and Medicaid. Contact information: [phone]        [facility] Follow up.   Why: Call to schedule a hospital follow up appointment. Contact information: [address] [place] [address] [phone]        [facility], Hedda Sl[place], [place]. Go on 08/22/2022.   Specialty: General Surgery Why: surgical follow up on 08/22/22 at 3 pm. Please arrive 30 minutes early to complete check in process and bring photo ID and insurance card if you have them Contact information: [address] [place] [address] [phone]                 Signed: Clarise Cruz [facility] 08/01/2022, 12:42 PM Please see Amion for pager number during day hours 7:00am-4:30pm

## 2022-08-01 NOTE — TOC Transition Note (Addendum)
Transition of Care [facility]) - CM/SW Discharge Note  Patient Details  Name: Sigrid Schwebach MRN: [id number] Date of Birth: 26-May-1991  [facility] CM/SW Contact:  Ewing Schlein, LCSW Phone Number: 08/01/2022, 12:32 PM  Clinical Narrative: [facility] consulted for medication assistance and homeless shelter/substance use resources. CSW spoke with patient and patient declined substance use and homeless shelter resources stating she did not need them. Patient asked about assistance with hospital bills. CSW explained patient will need to follow up with the billing department after discharge as TOC is not involved in billing. Patient asked about assistance with Medicaid and SNAP benefits. CSW added information for [facility] DSS to AVS so patient can be screened for eligibility. Patient also does not have a PCP, so contact information for [facility] was added to AVS.  CSW discussed medication assistance and patient will be provided a Good Rx coupon as she is discharging on 2 medications. CSW provided coupon to patient. RN updated. TOC signing off.  Final next level of care: Home/Self Care Barriers to Discharge: Barriers Resolved  Patient Goals and CMS Choice Patient states their goals for this hospitalization and ongoing recovery are:: Get assistance with hospital bills Choice offered to / list presented to : NA  Discharge Plan and Services         DME Arranged: N/A DME Agency: NA  Readmission Risk Interventions     No data to display

## 2022-08-01 NOTE — Anesthesia Postprocedure Evaluation (Signed)
Anesthesia Post Note  Patient: Angela [name]  Procedure(s) Performed: ENDOSCOPIC RETROGRADE CHOLANGIOPANCREATOGRAPHY (ERCP) SPHINCTEROTOMY REMOVAL OF STONES     Patient location during evaluation: Endoscopy Anesthesia Type: General Level of consciousness: awake and alert Pain management: pain level controlled Vital Signs Assessment: post-procedure vital signs reviewed and stable Respiratory status: spontaneous breathing, nonlabored ventilation, respiratory function stable and patient connected to nasal cannula oxygen Cardiovascular status: blood pressure returned to baseline and stable Postop Assessment: no apparent nausea or vomiting Anesthetic complications: no   No notable events documented.  Last Vitals:  Vitals:   08/01/22 0604 08/01/22 1309  BP: (!) 143/88 136/81  Pulse: 69 81  Resp: 18 17  Temp: 36.7 C 36.9 C  SpO2: 100% 99%    Last Pain:  Vitals:   08/01/22 1309  TempSrc: Oral  PainSc:    Pain Goal: Patients Stated Pain Goal: 2 (08/01/22 0504)                 Collene Schlichter

## 2022-08-01 NOTE — Progress Notes (Signed)
PHARMACIST - PHYSICIAN COMMUNICATION  Key Points: Use following P&T approved IV to PO diphenhydramine (Benadryl) policy. Description contains the criteria that are approved  DR:   CCS CONCERNING: IV to Oral Route Change Policy  RECOMMENDATION: This patient is receiving diphenhydramine by the intravenous route.  Based on criteria approved by the Pharmacy and Therapeutics Committee, intravenous diphenhydramine is being converted to the equivalent oral dose form(s).   DESCRIPTION: These criteria include: Diphenhydramine is not prescribed to treat or prevent a severe allergic reaction Diphenhydramine is not prescribed as premedication prior to receiving blood product, biologic medication, antimicrobial, or chemotherapy agent The patient has tolerated at least one dose of an oral or enteral medication The patient has no evidence of active gastrointestinal bleeding or impaired GI absorption (gastrectomy, short bowel, patient on TNA or NPO). The patient is not undergoing procedural sedation   If you have questions about this conversion, please contact the Pharmacy Department  []   [phone] )  ( [phone] []   [phone] )  [facility] []   [phone] )  [facility] []   [phone] )  [facility] [x]   [phone] )  [facility]     ( [phone], PharmD, BCPS 08/01/2022. 10:50 AM

## 2022-08-04 ENCOUNTER — Encounter (HOSPITAL_COMMUNITY): Payer: Self-pay | Admitting: Gastroenterology

## 2022-08-13 ENCOUNTER — Emergency Department (HOSPITAL_BASED_OUTPATIENT_CLINIC_OR_DEPARTMENT_OTHER)
Admission: EM | Admit: 2022-08-13 | Discharge: 2022-08-13 | Disposition: A | Discharge status: Home / Self Care | Payer: Self-pay | Attending: Emergency Medicine | Admitting: Emergency Medicine

## 2022-08-13 ENCOUNTER — Ambulatory Visit: Payer: Self-pay

## 2022-08-13 ENCOUNTER — Emergency Department (HOSPITAL_BASED_OUTPATIENT_CLINIC_OR_DEPARTMENT_OTHER): Payer: Self-pay

## 2022-08-13 ENCOUNTER — Encounter (HOSPITAL_BASED_OUTPATIENT_CLINIC_OR_DEPARTMENT_OTHER): Payer: Self-pay | Admitting: Pediatrics

## 2022-08-13 DIAGNOSIS — R1011 Right upper quadrant pain: Secondary | ICD-10-CM | POA: Insufficient documentation

## 2022-08-13 DIAGNOSIS — B349 Viral infection, unspecified: Secondary | ICD-10-CM | POA: Insufficient documentation

## 2022-08-13 DIAGNOSIS — Z20822 Contact with and (suspected) exposure to covid-19: Secondary | ICD-10-CM | POA: Insufficient documentation

## 2022-08-13 LAB — COMPREHENSIVE METABOLIC PANEL
ALT: 28 U/L (ref 0–44)
AST: 24 U/L (ref 15–41)
Albumin: 3.7 g/dL (ref 3.5–5.0)
Alkaline Phosphatase: 111 U/L (ref 38–126)
Anion gap: 8 (ref 5–15)
BUN: 5 mg/dL — ABNORMAL LOW (ref 6–20)
CO2: 20 mmol/L — ABNORMAL LOW (ref 22–32)
Calcium: 8.9 mg/dL (ref 8.9–10.3)
Chloride: 105 mmol/L (ref 98–111)
Creatinine, Ser: 0.81 mg/dL (ref 0.44–1.00)
GFR, Estimated: >60 mL/min (ref >60)
Glucose, Bld: 102 mg/dL — ABNORMAL HIGH (ref 70–99)
Potassium: 3.4 mmol/L — ABNORMAL LOW (ref 3.5–5.1)
Sodium: 133 mmol/L — ABNORMAL LOW (ref 135–145)
Total Bilirubin: 0.3 mg/dL (ref 0.3–1.2)
Total Protein: 8 g/dL (ref 6.5–8.1)

## 2022-08-13 LAB — URINALYSIS, ROUTINE W REFLEX MICROSCOPIC
Bilirubin Urine: NEGATIVE
Glucose, UA: NEGATIVE mg/dL
Hgb urine dipstick: NEGATIVE
Ketones, ur: NEGATIVE mg/dL
Nitrite: NEGATIVE
Protein, ur: NEGATIVE mg/dL
Specific Gravity, Urine: 1.015 (ref 1.005–1.030)
pH: 7 (ref 5.0–8.0)

## 2022-08-13 LAB — CBC WITH DIFFERENTIAL/PLATELET
Abs Immature Granulocytes: 0.17 10*3/uL — ABNORMAL HIGH (ref 0.00–0.07)
Basophils Absolute: 0.1 10*3/uL (ref 0.0–0.1)
Basophils Relative: 1 %
Eosinophils Absolute: 0.1 10*3/uL (ref 0.0–0.5)
Eosinophils Relative: 1 %
HCT: 37.1 % (ref 36.0–46.0)
Hemoglobin: 12.4 g/dL (ref 12.0–15.0)
Immature Granulocytes: 2 %
Lymphocytes Relative: 4 %
Lymphs Abs: 0.5 10*3/uL — ABNORMAL LOW (ref 0.7–4.0)
MCH: 29.6 pg (ref 26.0–34.0)
MCHC: 33.4 g/dL (ref 30.0–36.0)
MCV: 88.5 fL (ref 80.0–100.0)
Monocytes Absolute: 0.8 10*3/uL (ref 0.1–1.0)
Monocytes Relative: 8 %
Neutro Abs: 8.7 10*3/uL — ABNORMAL HIGH (ref 1.7–7.7)
Neutrophils Relative %: 84 %
Platelets: 293 10*3/uL (ref 150–400)
RBC: 4.19 MIL/uL (ref 3.87–5.11)
RDW: 14 % (ref 11.5–15.5)
WBC: 10.2 10*3/uL (ref 4.0–10.5)
nRBC: 0 % (ref 0.0–0.2)

## 2022-08-13 LAB — SARS CORONAVIRUS 2 BY RT PCR: SARS Coronavirus 2 by RT PCR: NEGATIVE

## 2022-08-13 LAB — URINALYSIS, MICROSCOPIC (REFLEX)

## 2022-08-13 LAB — PROTIME-INR
INR: 1 (ref 0.8–1.2)
Prothrombin Time: 13.1 seconds (ref 11.4–15.2)

## 2022-08-13 LAB — LACTIC ACID, PLASMA: Lactic Acid, Venous: 1.2 mmol/L (ref 0.5–1.9)

## 2022-08-13 LAB — PREGNANCY, URINE: Preg Test, Ur: NEGATIVE

## 2022-08-13 MED ORDER — ACETAMINOPHEN 500 MG PO TABS
1000.0000 mg | ORAL_TABLET | Freq: Four times a day (QID) | ORAL | Status: DC | PRN
  Administered 2022-08-13: 1000 mg via ORAL
  Filled 2022-08-13: qty 2

## 2022-08-13 MED ORDER — SODIUM CHLORIDE 0.9 % IV BOLUS (SEPSIS)
1000.0000 mL | Freq: Once | INTRAVENOUS | Status: AC
  Administered 2022-08-13: 1000 mL via INTRAVENOUS

## 2022-08-13 MED ORDER — SODIUM CHLORIDE 0.9 % IV SOLN
1000.0000 mL | INTRAVENOUS | Status: DC
  Administered 2022-08-13: 1000 mL via INTRAVENOUS

## 2022-08-13 MED ORDER — IOHEXOL 300 MG/ML  SOLN
100.0000 mL | Freq: Once | INTRAMUSCULAR | Status: AC | PRN
  Administered 2022-08-13: 100 mL via INTRAVENOUS

## 2022-08-13 NOTE — ED Provider Notes (Signed)
[facility] EMERGENCY DEPARTMENT Provider Note   CSN: [id number] Arrival date & time: 08/13/22  1509     History  Chief Complaint  Patient presents with   Post-op Problem    Angela [name] is a 31 y.o. female.  Pt complains of fever and abdominal pain   Pt reports she had a cholecystectomy on 8/22 and then a ERCP on 8/23.  Pt complains of having a fever today and abdominal pain.  Pt reports fever was 102 and she has pain in her right upper quadrant.  Pt reports she has nausea and vomiting.  Ptdenies exposure to anyone with illness  The history is provided by the patient. No language interpreter was used.       Home Medications Prior to Admission medications   Medication Sig Start Date End Date Taking? Authorizing Provider  Aspirin-Acetaminophen-Caffeine (GOODY HEADACHE PO) Take 1 packet by mouth 3 (three) times daily as needed (pain/headache).    [provider]  ibuprofen (ADVIL) 400 MG tablet Take 1-2 tablets (400-800 mg total) by mouth every 6 (six) hours as needed for headache, fever, mild pain, moderate pain or cramping (pain). 08/01/22   Karie Soda, MD  traMADol (ULTRAM) 50 MG tablet Take 1-2 tablets (50-100 mg total) by mouth every 6 (six) hours as needed for moderate pain or severe pain. 08/01/22   Karie Soda, MD      Allergies    Patient has no known allergies.    Review of Systems   Review of Systems  Constitutional:  Positive for fever.  Gastrointestinal:  Positive for abdominal pain.  Endocrine: Negative for polyuria.  Neurological:  Negative for weakness.  All other systems reviewed and are negative.   Physical Exam Updated Vital Signs BP 114/65   Pulse (!) 113   Temp (!) 100.9 F (38.3 C) (Oral)   Resp 18   Ht 5\' 5"  (1.651 m)   Wt 97.5 kg   LMP 07/22/2022 Comment: 07-29-21 POCT negative  SpO2 98%   BMI 35.78 kg/m  Physical Exam Vitals and nursing note reviewed.  Constitutional:      Appearance: She is well-developed.   HENT:     Head: Normocephalic.     Right Ear: External ear normal.     Left Ear: External ear normal.     Nose: Nose normal.     Mouth/Throat:     Mouth: Mucous membranes are moist.  Eyes:     Pupils: Pupils are equal, round, and reactive to light.  Cardiovascular:     Rate and Rhythm: Normal rate.  Pulmonary:     Effort: Pulmonary effort is normal.  Abdominal:     General: Abdomen is flat. There is no distension.     Tenderness: There is abdominal tenderness.  Musculoskeletal:        General: Normal range of motion.     Cervical back: Normal range of motion.  Skin:    General: Skin is warm.  Neurological:     General: No focal deficit present.     Mental Status: She is alert and oriented to person, place, and time.     ED Results / Procedures / Treatments   Labs (all labs ordered are listed, but only abnormal results are displayed) Labs Reviewed  COMPREHENSIVE METABOLIC PANEL - Abnormal; Notable for the following components:      Result Value   Sodium 133 (*)    Potassium 3.4 (*)    CO2 20 (*)    Glucose,  Bld 102 (*)    BUN 5 (*)    All other components within normal limits  CBC WITH DIFFERENTIAL/PLATELET - Abnormal; Notable for the following components:   Neutro Abs 8.7 (*)    Lymphs Abs 0.5 (*)    Abs Immature Granulocytes 0.17 (*)    All other components within normal limits  URINALYSIS, ROUTINE W REFLEX MICROSCOPIC - Abnormal; Notable for the following components:   APPearance HAZY (*)    Leukocytes,Ua SMALL (*)    All other components within normal limits  URINALYSIS, MICROSCOPIC (REFLEX) - Abnormal; Notable for the following components:   Bacteria, UA RARE (*)    All other components within normal limits  SARS CORONAVIRUS 2 BY RT PCR  CULTURE, BLOOD (ROUTINE X 2)  CULTURE, BLOOD (ROUTINE X 2)  LACTIC ACID, PLASMA  PROTIME-INR  PREGNANCY, URINE    EKG EKG Interpretation  Date/Time:  Tuesday August 13 2022 15:37:04 EDT Ventricular Rate:   109 PR Interval:  136 QRS Duration: 96 QT Interval:  323 QTC Calculation: 435 R Axis:   49 Text Interpretation: Sinus tachycardia Confirmed by Virgina Norfolk ([id number]) on 08/13/2022 3:45:49 PM  Radiology CT ABDOMEN PELVIS W CONTRAST  Result Date: 08/13/2022 CLINICAL DATA:  Abdominal pain and fever, post cholecystectomy 07/30/2022 with postoperative ERCP and sphincterotomy EXAM: CT ABDOMEN AND PELVIS WITH CONTRAST TECHNIQUE: Multidetector CT imaging of the abdomen and pelvis was performed using the standard protocol following bolus administration of intravenous contrast. RADIATION DOSE REDUCTION: This exam was performed according to the departmental dose-optimization program which includes automated exposure control, adjustment of the mA and/or kV according to patient size and/or use of iterative reconstruction technique. CONTRAST:  OMNIPAQUE IOHEXOL 300 MG/ML SOLN IV. No oral contrast. COMPARISON:  10/06/2019 FINDINGS: Lower chest: Tiny focus of infiltrate or atelectasis at base of RIGHT middle lobe Hepatobiliary: Post cholecystectomy. Liver normal appearance. Liver normal appearance. No biliary dilatation. Gallbladder fossa unremarkable. Pancreas: Normal appearance Spleen: Normal appearance Adrenals/Urinary Tract: Adrenal glands, kidneys, ureters, and bladder normal appearance Stomach/Bowel: Normal appendix. Stomach and bowel loops normal appearance Vascular/Lymphatic: Vascular structures patent.  No adenopathy. Reproductive: Uterus and ovaries unremarkable Other: No free air. Trace free fluid in pelvis. Stranding identified within fat planes in the RIGHT upper quadrant, inferior to the stomach and liver, likely related to recent laparoscopic procedure. An ill-defined area of opacity is seen measuring up to 2.5 x 1.5 cm diameter, does not appear to be a well-defined abscess collection. No hernia. Musculoskeletal: Unremarkable IMPRESSION: Stranding within fat planes in the RIGHT upper quadrant inferior to  the stomach and liver, likely related to recent laparoscopic procedure. An ill-defined area of RIGHT upper quadrant opacity is seen within this region measuring up to 2.5 x 1.5 cm diameter, does not appear to be a well-defined abscess collection, may be postoperative change, hemorrhage, or phlegmon; infection cannot be excluded by CT. Trace free fluid in pelvis. Tiny focus of infiltrate or atelectasis at base of RIGHT middle lobe. Electronically Signed   By: Ulyses Southward M.D.   On: 08/13/2022 17:47   DG Chest Portable 1 View  Result Date: 08/13/2022 CLINICAL DATA:  sepsis protocol. EXAM: PORTABLE CHEST 1 VIEW COMPARISON:  Radiographs 09/25/2019. FINDINGS: 1540 hours. The heart size and mediastinal contours are normal. The lungs are clear. There is no pleural effusion or pneumothorax. No acute osseous findings are identified. Telemetry leads overlie the chest. IMPRESSION: No active cardiopulmonary process. Electronically Signed   By: Carey Bullocks M.D.   On: 08/13/2022  15:55    Procedures Procedures    Medications Ordered in ED Medications  sodium chloride 0.9 % bolus 1,000 mL (0 mLs Intravenous Stopped 08/13/22 1707)    Followed by  sodium chloride 0.9 % bolus 1,000 mL (0 mLs Intravenous Stopped 08/13/22 1708)    Followed by  0.9 %  sodium chloride infusion (1,000 mLs Intravenous New Bag/Given 08/13/22 1826)  acetaminophen (TYLENOL) tablet 1,000 mg (1,000 mg Oral Given 08/13/22 1620)  iohexol (OMNIPAQUE) 300 MG/ML solution 100 mL (100 mLs Intravenous Contrast Given 08/13/22 1727)    ED Course/ Medical Decision Making/ A&P                           Medical Decision Making Pt had a cholecystectomy on 8/22 and an ERCP on 8/23.  Pt reports she had a fever of 102 today   Amount and/or Complexity of Data Reviewed External Data Reviewed: notes.    Details: Surgery and Gi notes reviewd  Labs: ordered. Decision-making details documented in ED Course.    Details: Labs ordered, reviewed and interpreted.   Pt has a normal wbc count  Covid is negative  Radiology: ordered and independent interpretation performed. Decision-making details documented in ED Course.    Details: Ct abdomen  no acute  Discussion of management or test interpretation with external provider(s): I discussed pt with Dt. Kinsinger.  He reviewed ct scan and advised to have pt follow up with them   Risk OTC drugs. Prescription drug management. Risk Details: I suspect viral illness.  I advised tylenol every 4 hours.             Final Clinical Impression(s) / ED Diagnoses Final diagnoses:  Viral illness    Rx / DC Orders ED Discharge Orders     None      An After Visit Summary was printed and given to the patient.    Elson Areas, PA-C 08/14/22 1610    Virgina Norfolk, DO 08/16/22 2155

## 2022-08-13 NOTE — Telephone Encounter (Signed)
  Chief Complaint: vomiting Symptoms: vomiting, diarrhea, fever 103, body aches, abdominal pain 8/10 Frequency: started last night  Pertinent Negatives: NA Disposition: [x] ED /[] Urgent Care (no appt availability in office) / [] Appointment(In office/virtual)/ []  [place] [facility]/ [] Home Care/ [] Refused Recommended Disposition /[] [facility]/ []  Follow-up with PCP Additional Notes: pt has been unable to keep fluids down and had gallbladder removal on 07/29/22. Pt was dc'd on 08/01/22. I asked pt if she had any COVID home tests but pt didn't have any. I advised pt based on sx to go to ED and be evaluated. Pt verbalized understanding.   Reason for Disposition  [1] Constant abdominal pain AND [2] present > 2 hours  Answer Assessment - Initial Assessment Questions 1. VOMITING SEVERITY: "How many times have you vomited in the past 24 hours?"     - MILD:  1 - 2 times/day    - MODERATE: 3 - 5 times/day, decreased oral intake without significant weight loss or symptoms of dehydration    - SEVERE: 6 or more times/day, vomits everything or nearly everything, with significant weight loss, symptoms of dehydration      Moderate  2. ONSET: "When did the vomiting begin?"      Started last night 3. FLUIDS: "What fluids or food have you vomited up today?" "Have you been able to keep any fluids down?"     Not a lot  4. ABDOMEN PAIN: "Are your having any abdomen pain?" If Yes : "How bad is it and what does it feel like?" (e.g., crampy, dull, intermittent, constant)      Yes 8/10 5. DIARRHEA: "Is there any diarrhea?" If Yes, ask: "How many times today?"      Yes same as diarrhea  6. CONTACTS: "Is there anyone else in the family with the same symptoms?"      no 8. HYDRATION STATUS: "Any signs of dehydration?" (e.g., dry mouth [not only dry lips], too weak to stand) "When did you last urinate?"     Unsure  9. OTHER SYMPTOMS: "Do you have any other symptoms?" (e.g., fever, headache, vertigo,  vomiting blood or coffee grounds, recent head injury)     Body aches, fever 103, incision sites hurting,  Protocols used: Vomiting-A-AH

## 2022-08-13 NOTE — ED Triage Notes (Signed)
Arrived POV; c/o "passing out"; reported Gallbladder sx on 07/31/2022;

## 2022-08-13 NOTE — Discharge Instructions (Addendum)
Tylenol every 4 hours.  Drink plenty of fluids.  Call your surgeons office to schedule recheck.

## 2022-08-18 LAB — CULTURE, BLOOD (ROUTINE X 2)
Culture: NO GROWTH
Special Requests: ADEQUATE

## 2022-08-22 ENCOUNTER — Encounter (HOSPITAL_COMMUNITY): Payer: Self-pay

## 2022-08-22 ENCOUNTER — Emergency Department (HOSPITAL_COMMUNITY): Payer: Self-pay

## 2022-08-22 ENCOUNTER — Other Ambulatory Visit: Payer: Self-pay

## 2022-08-22 ENCOUNTER — Emergency Department (HOSPITAL_COMMUNITY)
Admission: EM | Admit: 2022-08-22 | Discharge: 2022-08-23 | Disposition: A | Discharge status: Home / Self Care | Payer: Self-pay | Attending: Emergency Medicine | Admitting: Emergency Medicine

## 2022-08-22 DIAGNOSIS — R112 Nausea with vomiting, unspecified: Secondary | ICD-10-CM | POA: Insufficient documentation

## 2022-08-22 DIAGNOSIS — R1084 Generalized abdominal pain: Secondary | ICD-10-CM | POA: Insufficient documentation

## 2022-08-22 DIAGNOSIS — H6691 Otitis media, unspecified, right ear: Secondary | ICD-10-CM | POA: Insufficient documentation

## 2022-08-22 DIAGNOSIS — J029 Acute pharyngitis, unspecified: Secondary | ICD-10-CM | POA: Insufficient documentation

## 2022-08-22 DIAGNOSIS — Z20822 Contact with and (suspected) exposure to covid-19: Secondary | ICD-10-CM | POA: Insufficient documentation

## 2022-08-22 DIAGNOSIS — H6690 Otitis media, unspecified, unspecified ear: Secondary | ICD-10-CM

## 2022-08-22 DIAGNOSIS — R059 Cough, unspecified: Secondary | ICD-10-CM | POA: Insufficient documentation

## 2022-08-22 LAB — CBC
HCT: 39.4 % (ref 36.0–46.0)
Hemoglobin: 12.9 g/dL (ref 12.0–15.0)
MCH: 29.7 pg (ref 26.0–34.0)
MCHC: 32.7 g/dL (ref 30.0–36.0)
MCV: 90.8 fL (ref 80.0–100.0)
Platelets: 341 10*3/uL (ref 150–400)
RBC: 4.34 MIL/uL (ref 3.87–5.11)
RDW: 13.8 % (ref 11.5–15.5)
WBC: 17.1 10*3/uL — ABNORMAL HIGH (ref 4.0–10.5)
nRBC: 0 % (ref 0.0–0.2)

## 2022-08-22 LAB — COMPREHENSIVE METABOLIC PANEL
ALT: 19 U/L (ref 0–44)
AST: 15 U/L (ref 15–41)
Albumin: 3.7 g/dL (ref 3.5–5.0)
Alkaline Phosphatase: 83 U/L (ref 38–126)
Anion gap: 7 (ref 5–15)
BUN: 5 mg/dL — ABNORMAL LOW (ref 6–20)
CO2: 23 mmol/L (ref 22–32)
Calcium: 9.1 mg/dL (ref 8.9–10.3)
Chloride: 107 mmol/L (ref 98–111)
Creatinine, Ser: 0.82 mg/dL (ref 0.44–1.00)
GFR, Estimated: >60 mL/min (ref >60)
Glucose, Bld: 103 mg/dL — ABNORMAL HIGH (ref 70–99)
Potassium: 3.4 mmol/L — ABNORMAL LOW (ref 3.5–5.1)
Sodium: 137 mmol/L (ref 135–145)
Total Bilirubin: 0.7 mg/dL (ref 0.3–1.2)
Total Protein: 7.3 g/dL (ref 6.5–8.1)

## 2022-08-22 LAB — LIPASE, BLOOD: Lipase: 27 U/L (ref 11–51)

## 2022-08-22 LAB — HCG, QUANTITATIVE, PREGNANCY: hCG, Beta Chain, Quant, S: <1 m[IU]/mL (ref <5)

## 2022-08-22 MED ORDER — HYDROCODONE-ACETAMINOPHEN 5-325 MG PO TABS
1.0000 | ORAL_TABLET | Freq: Once | ORAL | Status: AC
  Administered 2022-08-22: 1 via ORAL
  Filled 2022-08-22: qty 1

## 2022-08-22 MED ORDER — IOHEXOL 300 MG/ML  SOLN
100.0000 mL | Freq: Once | INTRAMUSCULAR | Status: AC | PRN
  Administered 2022-08-22: 100 mL via INTRAVENOUS

## 2022-08-22 NOTE — ED Notes (Signed)
Pt needs HCG collected for CT

## 2022-08-22 NOTE — ED Notes (Signed)
Pt needs IV for CT  

## 2022-08-22 NOTE — ED Notes (Signed)
Pt informed of wait time. Pt states "I ain't sitting here that long, this is ridiculous. My surgeon sent me." Pt made aware that we room based off acuity and wait time. Pt informed VSS at this time.

## 2022-08-22 NOTE — ED Triage Notes (Signed)
Ambulatory to ED with c/o nausea, vomiting, diarrhea, and abd pain s/p chole on 8/23. Seen  last week for same.

## 2022-08-23 ENCOUNTER — Emergency Department (HOSPITAL_COMMUNITY): Payer: Self-pay

## 2022-08-23 LAB — URINALYSIS, ROUTINE W REFLEX MICROSCOPIC
Bilirubin Urine: NEGATIVE
Glucose, UA: NEGATIVE mg/dL
Hgb urine dipstick: NEGATIVE
Ketones, ur: NEGATIVE mg/dL
Leukocytes,Ua: NEGATIVE
Nitrite: NEGATIVE
Protein, ur: NEGATIVE mg/dL
Specific Gravity, Urine: <=1.005 (ref 1.005–1.030)
pH: 6 (ref 5.0–8.0)

## 2022-08-23 LAB — RESP PANEL BY RT-PCR (FLU A&B, COVID) ARPGX2
Influenza A by PCR: NEGATIVE
Influenza B by PCR: NEGATIVE
SARS Coronavirus 2 by RT PCR: NEGATIVE

## 2022-08-23 MED ORDER — KETOROLAC TROMETHAMINE 15 MG/ML IJ SOLN
15.0000 mg | Freq: Once | INTRAMUSCULAR | Status: AC
  Administered 2022-08-23: 15 mg via INTRAVENOUS
  Filled 2022-08-23: qty 1

## 2022-08-23 MED ORDER — AMOXICILLIN-POT CLAVULANATE 875-125 MG PO TABS
1.0000 | ORAL_TABLET | Freq: Once | ORAL | Status: AC
  Administered 2022-08-23: 1 via ORAL
  Filled 2022-08-23: qty 1

## 2022-08-23 MED ORDER — AMOXICILLIN-POT CLAVULANATE 875-125 MG PO TABS: 1.0000 | ORAL_TABLET | Freq: Two times a day (BID) | ORAL | 0 refills | Status: DC

## 2022-08-23 MED ORDER — SODIUM CHLORIDE 0.9 % IV BOLUS
1000.0000 mL | Freq: Once | INTRAVENOUS | Status: AC
  Administered 2022-08-23: 1000 mL via INTRAVENOUS

## 2022-08-23 NOTE — Discharge Instructions (Signed)
Follow-up with your PCP and your surgeon for your post-op abdominal pain that has been present since your surgery. Take your antibiotic in full.

## 2022-08-23 NOTE — ED Notes (Signed)
Pt ambulated to BR, NAD noted, steady gait observed.

## 2022-08-23 NOTE — ED Provider Notes (Signed)
[facility]-EMERGENCY DEPT Provider Note   CSN: [id number] Arrival date & time: 08/22/22  1903     History  Chief Complaint  Patient presents with   Abdominal Pain    Angela [name] is a 31 y.o. female, hx of cholecystectomy, nephrolithiasis, who presents to the ED secondary to continued abdominal pain, since her surgery on 8/23, nausea, vomiting, and now fever for the last week and a half.  She states she was seen here in the last week, and was told was a viral illness, but her fever has been persistent.  She also notes that she is having some right ear pain, and she had some blood come out of her ear today.  She also reports that she has sore throat.  States she called the surgeon's office today, and she was told to come into the ED to get further evaluated.   Of note patient states that her abdominal pain has not gotten worse since her surgery was initially done, the abdominal pain has just been persistent, and not worsening in nature.  She has also had vomiting since she had surgery as well, and this is unchanged.  Abdominal Pain Associated symptoms: cough, fever and sore throat        Home Medications Prior to Admission medications   Medication Sig Start Date End Date Taking? Authorizing Provider  ibuprofen (ADVIL) 400 MG tablet Take 1-2 tablets (400-800 mg total) by mouth every 6 (six) hours as needed for headache, fever, mild pain, moderate pain or cramping (pain). 08/01/22  Yes Karie Soda, MD  amoxicillin-clavulanate (AUGMENTIN) 875-125 MG tablet Take 1 tablet by mouth every 12 (twelve) hours. 08/23/22   Dowell Hoon L, PA  traMADol (ULTRAM) 50 MG tablet Take 1-2 tablets (50-100 mg total) by mouth every 6 (six) hours as needed for moderate pain or severe pain. Patient not taking: Reported on 08/23/2022 08/01/22   Karie Soda, MD      Allergies    Patient has no known allergies.    Review of Systems   Review of Systems  Constitutional:  Positive for  fever.  HENT:  Positive for ear pain and sore throat.   Respiratory:  Positive for cough.   Gastrointestinal:  Positive for abdominal pain.    Physical Exam Updated Vital Signs BP 112/74   Pulse 87   Temp 99 F (37.2 C) (Oral)   Resp 17   LMP 07/22/2022 Comment: 07-29-21 POCT negative  SpO2 97%  Physical Exam Vitals and nursing note reviewed.  Constitutional:      Appearance: Normal appearance.  HENT:     Head: Normocephalic and atraumatic.     Right Ear: Tympanic membrane is bulging.     Left Ear: Tympanic membrane normal.     Nose: Nose normal.     Mouth/Throat:     Mouth: Mucous membranes are moist.     Pharynx: Oropharynx is clear. Posterior oropharyngeal erythema present. No pharyngeal swelling or oropharyngeal exudate.  Eyes:     Extraocular Movements: Extraocular movements intact.     Conjunctiva/sclera: Conjunctivae normal.     Pupils: Pupils are equal, round, and reactive to light.  Cardiovascular:     Rate and Rhythm: Normal rate and regular rhythm.  Pulmonary:     Effort: Pulmonary effort is normal.     Breath sounds: Normal breath sounds.  Abdominal:     General: Abdomen is flat. Bowel sounds are normal.     Palpations: Abdomen is soft.  Tenderness: There is generalized abdominal tenderness.  Musculoskeletal:        General: Normal range of motion.     Cervical back: Normal range of motion and neck supple.  Skin:    General: Skin is warm and dry.     Capillary Refill: Capillary refill takes less than 2 seconds.  Neurological:     General: No focal deficit present.     Mental Status: She is alert.  Psychiatric:        Mood and Affect: Mood normal.        Thought Content: Thought content normal.     ED Results / Procedures / Treatments   Labs (all labs ordered are listed, but only abnormal results are displayed) Labs Reviewed  COMPREHENSIVE METABOLIC PANEL - Abnormal; Notable for the following components:      Result Value   Potassium 3.4 (*)     Glucose, Bld 103 (*)    BUN 5 (*)    All other components within normal limits  CBC - Abnormal; Notable for the following components:   WBC 17.1 (*)    All other components within normal limits  RESP PANEL BY RT-PCR (FLU A&B, COVID) ARPGX2  LIPASE, BLOOD  URINALYSIS, ROUTINE W REFLEX MICROSCOPIC  HCG, QUANTITATIVE, PREGNANCY    EKG None  Radiology DG Chest 1 View  Result Date: 08/23/2022 CLINICAL DATA:  History of recent cholecystectomy presenting with nausea, vomiting, diarrhea and abdominal pain. EXAM: CHEST  1 VIEW COMPARISON:  August 13, 2022 FINDINGS: The heart size and mediastinal contours are within normal limits. Both lungs are clear. The visualized skeletal structures are unremarkable. IMPRESSION: No active disease. Electronically Signed   By: Aram Candela M.D.   On: 08/23/2022 01:12   CT Abdomen Pelvis W Contrast  Result Date: 08/22/2022 CLINICAL DATA:  Nausea, vomiting, diarrhea, and abdominal pain status post cholecystectomy 8/23 EXAM: CT ABDOMEN AND PELVIS WITH CONTRAST TECHNIQUE: Multidetector CT imaging of the abdomen and pelvis was performed using the standard protocol following bolus administration of intravenous contrast. RADIATION DOSE REDUCTION: This exam was performed according to the departmental dose-optimization program which includes automated exposure control, adjustment of the mA and/or kV according to patient size and/or use of iterative reconstruction technique. CONTRAST:  OMNIPAQUE IOHEXOL 300 MG/ML  SOLN COMPARISON:  CT abdomen and pelvis 08/13/2022 FINDINGS: Lower chest: No acute abnormality. Hepatobiliary: Cholecystectomy. Liver is unremarkable. No biliary dilation. Pancreas: Unremarkable. Spleen: Unremarkable. Adrenals/Urinary Tract: Adrenal glands are unremarkable. Kidneys are normal, without renal calculi, focal lesion, or hydronephrosis. Bladder is unremarkable. Stomach/Bowel: Stomach is within normal limits. Appendix appears normal. No  evidence of bowel wall thickening, distention, or inflammatory changes. Vascular/Lymphatic: No significant vascular findings are present. No enlarged abdominal or pelvic lymph nodes. Reproductive: Uterus and bilateral adnexa are unremarkable. Other: No free air. Decreased stranding in the right upper quadrant inferior to the stomach and liver compatible with resolving postoperative change. Decreased size of a Devinne Epstein irregular fluid collection in the right upper quadrant now measuring 1.6 x 0.8 cm, previously 1.7 x 1.8 cm using similar measuring technique circa series 6/image 26. Musculoskeletal: No acute or significant osseous findings. IMPRESSION: No acute findings. Decreased stranding in the right upper quadrant with decreased size of a Deandre Stansel fluid collection. Findings are consistent with improving postoperative change after cholecystectomy. Electronically Signed   By: Minerva Fester M.D.   On: 08/22/2022 22:22    Procedures Procedures   Medications Ordered in ED Medications  HYDROcodone-acetaminophen (NORCO/VICODIN) 5-325 MG per  tablet 1 tablet (1 tablet Oral Given 08/22/22 2050)  iohexol (OMNIPAQUE) 300 MG/ML solution 100 mL (100 mLs Intravenous Contrast Given 08/22/22 2208)  ketorolac (TORADOL) 15 MG/ML injection 15 mg (15 mg Intravenous Given 08/23/22 0102)  sodium chloride 0.9 % bolus 1,000 mL (1,000 mLs Intravenous New Bag/Given 08/23/22 0100)  amoxicillin-clavulanate (AUGMENTIN) 875-125 MG per tablet 1 tablet (1 tablet Oral Given 08/23/22 0102)    ED Course/ Medical Decision Making/ A&P                           Medical Decision Making Amount and/or Complexity of Data Reviewed Labs: ordered. Radiology: ordered.  Risk Prescription drug management.  This patient presents to the ED for concern of fever, abdominal pain, ear pain, sore throat, this involves an extensive number of treatment options, and is a complaint that carries with it a high risk of complications and morbidity.  T  Co  morbidities that complicate the patient evaluation  Recent cholecystectomy   Additional history obtained:  Additional history obtained from previous ED visit on 9/5   Lab Tests:  I Ordered, and personally interpreted labs.  The pertinent results include:  leukocytosis of 17k, CMP WNL   Imaging Studies ordered:  I ordered imaging studies including CT abd/pelvis and cxr  I independently visualized and interpreted imaging which showed healing post cholecystectomy, no evidence of pneumonia I agree with the radiologist interpretation   Problem List / ED Course / Critical interventions / Medication management  I ordered medication including toradol, augmentin and IVF  for ear ache and abdominal pain  Reevaluation of the patient after these medicines showed that the patient improved I have reviewed the patients home medicines and have made adjustments as needed  Test / Admission - Considered:  Patient is a 31 year old female, history of recent cholecystectomy, here for abdominal pain, fever, ear pain, and sore throat.  She had no exudates in her throat, and her tonsils were within normal limits.  COVID/flu was negative.  She did find have a bulging TM of the right ear which is likely source of the fever, from a URI which turned into a bacterial otitis media.  She was prescribed Augmentin, and provided the prescription, instructed take it with food, follow-up with PCP.  Abdominal pain appears to be chronic since her surgery, it is not worsening in nature, and her CT abdomen pelvis was unremarkable except for healing cholecystectomy, and UA was unremarkable as well.  She was advised to follow-up with PCP and her surgeon for her postop visit as she has not done that yet.  Return symptoms were emphasized.  Patient voiced understanding.  Instructed to take Tylenol and ibuprofen for pain control. Final Clinical Impression(s) / ED Diagnoses Final diagnoses:  Generalized abdominal pain  Acute  otitis media, unspecified otitis media type  Nausea and vomiting, unspecified vomiting type    Rx / DC Orders ED Discharge Orders          Ordered    amoxicillin-clavulanate (AUGMENTIN) 875-125 MG tablet  Every 12 hours,   Status:  Discontinued        08/23/22 0207    amoxicillin-clavulanate (AUGMENTIN) 875-125 MG tablet  Every 12 hours        08/23/22 0208              Kristofor Michalowski, Harley [place], PA 08/23/22 6301    Gilda Crease, MD 08/23/22 [phone]

## 2022-11-02 ENCOUNTER — Encounter (HOSPITAL_COMMUNITY): Payer: Self-pay

## 2022-11-02 ENCOUNTER — Other Ambulatory Visit: Payer: Self-pay

## 2022-11-02 ENCOUNTER — Encounter (HOSPITAL_BASED_OUTPATIENT_CLINIC_OR_DEPARTMENT_OTHER): Payer: Self-pay | Admitting: Emergency Medicine

## 2022-11-02 ENCOUNTER — Emergency Department (HOSPITAL_COMMUNITY)
Admission: EM | Admit: 2022-11-02 | Discharge: 2022-11-02 | Discharge status: Against Medical Advice | Payer: Self-pay | Attending: Emergency Medicine | Admitting: Emergency Medicine

## 2022-11-02 ENCOUNTER — Emergency Department (HOSPITAL_COMMUNITY): Payer: Self-pay

## 2022-11-02 DIAGNOSIS — S41052A Open bite of left shoulder, initial encounter: Secondary | ICD-10-CM | POA: Insufficient documentation

## 2022-11-02 DIAGNOSIS — Z5321 Procedure and treatment not carried out due to patient leaving prior to being seen by health care provider: Secondary | ICD-10-CM | POA: Insufficient documentation

## 2022-11-02 DIAGNOSIS — R110 Nausea: Secondary | ICD-10-CM | POA: Insufficient documentation

## 2022-11-02 DIAGNOSIS — R519 Headache, unspecified: Secondary | ICD-10-CM | POA: Insufficient documentation

## 2022-11-02 DIAGNOSIS — Z23 Encounter for immunization: Secondary | ICD-10-CM | POA: Insufficient documentation

## 2022-11-02 DIAGNOSIS — M542 Cervicalgia: Secondary | ICD-10-CM | POA: Insufficient documentation

## 2022-11-02 LAB — BASIC METABOLIC PANEL
Anion gap: 6 (ref 5–15)
BUN: 8 mg/dL (ref 6–20)
CO2: 21 mmol/L — ABNORMAL LOW (ref 22–32)
Calcium: 8.5 mg/dL — ABNORMAL LOW (ref 8.9–10.3)
Chloride: 109 mmol/L (ref 98–111)
Creatinine, Ser: 0.73 mg/dL (ref 0.44–1.00)
GFR, Estimated: >60 mL/min (ref >60)
Glucose, Bld: 111 mg/dL — ABNORMAL HIGH (ref 70–99)
Potassium: 3.9 mmol/L (ref 3.5–5.1)
Sodium: 136 mmol/L (ref 135–145)

## 2022-11-02 LAB — CBC
HCT: 40.3 % (ref 36.0–46.0)
Hemoglobin: 13.1 g/dL (ref 12.0–15.0)
MCH: 29.2 pg (ref 26.0–34.0)
MCHC: 32.5 g/dL (ref 30.0–36.0)
MCV: 89.8 fL (ref 80.0–100.0)
Platelets: 292 10*3/uL (ref 150–400)
RBC: 4.49 MIL/uL (ref 3.87–5.11)
RDW: 15 % (ref 11.5–15.5)
WBC: 12.2 10*3/uL — ABNORMAL HIGH (ref 4.0–10.5)
nRBC: 0 % (ref 0.0–0.2)

## 2022-11-02 MED ORDER — IBUPROFEN 200 MG PO TABS
600.0000 mg | ORAL_TABLET | Freq: Once | ORAL | Status: AC
  Administered 2022-11-02: 600 mg via ORAL
  Filled 2022-11-02: qty 3

## 2022-11-02 MED ORDER — ONDANSETRON 8 MG PO TBDP
8.0000 mg | ORAL_TABLET | Freq: Once | ORAL | Status: AC
  Administered 2022-11-02: 8 mg via ORAL
  Filled 2022-11-02: qty 1
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
## 2022-11-03 ENCOUNTER — Emergency Department (HOSPITAL_BASED_OUTPATIENT_CLINIC_OR_DEPARTMENT_OTHER)
Admission: EM | Admit: 2022-11-03 | Discharge: 2022-11-03 | Disposition: A | Discharge status: Home / Self Care | Payer: Self-pay | Attending: Emergency Medicine | Admitting: Emergency Medicine

## 2022-11-03 DIAGNOSIS — W503XXA Accidental bite by another person, initial encounter: Secondary | ICD-10-CM

## 2022-11-03 MED ORDER — AMOXICILLIN-POT CLAVULANATE 500-125 MG PO TABS: 1.0000 | ORAL_TABLET | Freq: Three times a day (TID) | ORAL | 0 refills | Status: AC

## 2022-11-03 MED ORDER — AMOXICILLIN-POT CLAVULANATE 875-125 MG PO TABS
1.0000 | ORAL_TABLET | Freq: Once | ORAL | Status: AC
  Administered 2022-11-03: 1 via ORAL
  Filled 2022-11-03: qty 1

## 2022-11-03 MED ORDER — TETANUS-DIPHTH-ACELL PERTUSSIS 5-2.5-18.5 LF-MCG/0.5 IM SUSY
0.5000 mL | PREFILLED_SYRINGE | Freq: Once | INTRAMUSCULAR | Status: AC
  Administered 2022-11-03: 0.5 mL via INTRAMUSCULAR
  Filled 2022-11-03: qty 0.5
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
## 2022-11-03 NOTE — Discharge Instructions (Signed)
Begin taking Augmentin as prescribed.  Local wound care with bacitracin and dressing changes twice daily.  Return to the emergency department for pus draining from the wound, redness surrounding the wounds streaks extending down or up the back, or for other new and concerning symptoms.
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
## 2022-12-22 ENCOUNTER — Ambulatory Visit (HOSPITAL_COMMUNITY)
Admission: EM | Admit: 2022-12-22 | Discharge: 2022-12-22 | Disposition: A | Discharge status: Home / Self Care | Payer: Medicaid Other | Attending: Psychiatry | Admitting: Psychiatry

## 2022-12-22 DIAGNOSIS — Z91410 Personal history of adult physical and sexual abuse: Secondary | ICD-10-CM | POA: Insufficient documentation

## 2022-12-22 DIAGNOSIS — Z9151 Personal history of suicidal behavior: Secondary | ICD-10-CM | POA: Insufficient documentation

## 2022-12-22 DIAGNOSIS — G4700 Insomnia, unspecified: Secondary | ICD-10-CM | POA: Insufficient documentation

## 2022-12-22 DIAGNOSIS — F320 Major depressive disorder, single episode, mild: Secondary | ICD-10-CM | POA: Insufficient documentation

## 2022-12-22 NOTE — Discharge Instructions (Signed)

## 2022-12-22 NOTE — Progress Notes (Signed)
Angela [name] ROUTINE: is a 32 year old female who presents voluntarily to [facility] ([facility]) due to having crying spells, racing thoughts and difficulty sleeping. The patient denies SI/HI AVH, but she has past active SI. Patient reports previous hospitalization for her mental health in [place] in either 2019 or 2020. She is unsure of the year. She has diagnosis of bipolar 1, borderline personality disorder, anxiety, depression, ADHS and ADD. The patient was prescribed Seroquel upon hospital discharge but stopped taking it about 6 months after leaving the hospital. Patient denies alcohol use but admits to smoking marijuana 4 out of 7 days a week, usually, a blunt. Patient states that marijuana is what helps keep her going. Patient was casually dressed, alert & oriented. She was anxious and cooperative.     12/22/22 1522  [place] (Walk-ins at [facility] only)  How Did You Hear About [place]? Self  What Is the Reason for Your Visit/Call Today? Patient is a 32 year old female who presents voluntarily to [facility] due to having crying spells, racing thoughts and difficulty sleeping.  Patient denies SI/HI AVH, although she has past history of active SI.  Patient reports previous hospitalization for her mental health in [place] in either 2019 or 2020. she is unsure of the year.  She has diagnosis of bipolar 1, borderline personality disorder, anxiety, depression, ADHS and ADD.  How Long Has This Been Causing You Problems? 1-6 months  Have You Recently Had Any Thoughts About Hurting Yourself? No  Are You Planning to Commit Suicide/Harm Yourself At This time? No  Have you Recently Had Thoughts About [place]? No  Are You Planning To Harm Someone At This Time? No  Are you currently experiencing any auditory, visual or other hallucinations? No  Have You Used Any Alcohol or Drugs in the Past 24 Hours? No (Patient uses marijuana 4 out of 7 days a week.)  Do you have any current  medical co-morbidities that require immediate attention? No  Clinician description of patient physical appearance/behavior: Patient was casually dressed, alert & oriented. She was anxious and cooperative.  What Do You Feel Would Help You the Most Today? Treatment for Depression or other mood problem;Medication(s)  If access to [facility] was not available, would you have sought care in the Emergency Department? No  Determination of Need Routine (7 days)  Options For Referral Medication Management;Outpatient Therapy
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
## 2022-12-23 ENCOUNTER — Ambulatory Visit (INDEPENDENT_AMBULATORY_CARE_PROVIDER_SITE_OTHER): Payer: Medicaid Other | Admitting: Psychiatry

## 2022-12-23 VITALS — BP 123/75 | HR 90

## 2022-12-23 DIAGNOSIS — F3181 Bipolar II disorder: Secondary | ICD-10-CM

## 2022-12-23 DIAGNOSIS — F1210 Cannabis abuse, uncomplicated: Secondary | ICD-10-CM | POA: Diagnosis not present

## 2022-12-23 MED ORDER — QUETIAPINE FUMARATE 50 MG PO TABS: 50.0000 mg | ORAL_TABLET | Freq: Every day | ORAL | 1 refills | Status: DC
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
## 2022-12-24 ENCOUNTER — Encounter (HOSPITAL_COMMUNITY): Payer: Self-pay | Admitting: Psychiatry

## 2023-01-13 ENCOUNTER — Ambulatory Visit (INDEPENDENT_AMBULATORY_CARE_PROVIDER_SITE_OTHER): Payer: Medicaid Other | Admitting: Mental Health

## 2023-01-13 DIAGNOSIS — F3181 Bipolar II disorder: Secondary | ICD-10-CM

## 2023-01-13 DIAGNOSIS — F1210 Cannabis abuse, uncomplicated: Secondary | ICD-10-CM
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
## 2023-01-20 ENCOUNTER — Ambulatory Visit (HOSPITAL_COMMUNITY): Payer: Medicaid Other | Admitting: Psychiatry

## 2023-01-20 DIAGNOSIS — F3181 Bipolar II disorder: Secondary | ICD-10-CM

## 2023-01-20 MED ORDER — QUETIAPINE FUMARATE 100 MG PO TABS: 100.0000 mg | ORAL_TABLET | Freq: Every day | ORAL | 2 refills | Status: DC

## 2023-01-20 NOTE — Progress Notes (Signed)
BH MD/PA/NP OP Progress Note  01/20/2023 2:53 PM Angela [name]  MRN:  [id number]  Chief Complaint:  Chief Complaint  Patient presents with   Follow-up   HPI: Patient is a 32 year old female with past psychiatric history of bipolar 2 disorder, borderline personality disorder, cannabis abuse presented to [facility] outpatient clinic for medication management follow-up.  Pt reports that her mood is " better". She reports improvement in manic symptoms, depression, irritability, sleep, mood swings and anxiety.  She reports that she still has episodes when she gets angry but not as severe as before.  She has been sleeping well.  She reports increase in her appetite.  She reports that she wakes up in the middle of the night craving for bowl of cereal.  She reports that she has gained about 5 pounds of weight since last visit.  She reports that her current weight is 236 lbs.  Discussed monitoring weight, eating healthy and exercising regularly.  She agrees with the plan.  Currently, she is not reporting any suicidal ideations, homicidal ideations, auditory and visual hallucinations or  paranoia.  She denies any medication side effects and has been tolerating it well.  She reports vivid dreams.   She reports no change in her current stressors.  She started new job at [employer] and lives with dad and his girlfriend. She denies drinking alcohol  and uses marijuana daily.  She reports that she has cut down on marijuana use from 3 times per day to 1 time per day.  Recommended complete cessation.  Educated about the negative effects of marijuana.  She does not smoke cigarettes.  Patient is amenable to increase in Seroquel to help with anger and irritability..  She denies any other concerns. Patient is alert and oriented x 4,  calm, cooperative, and fully engaged in conversation during the encounter.  Her thought process is linear with coherent speech . She does not appear to be responding to internal/external stimuli .     Visit Diagnosis:    ICD-10-CM   1. Bipolar II disorder, severe, depressed, with anxious distress (HCC)  F31.81 QUEtiapine (SEROQUEL) 100 MG tablet      Past Psychiatric History: Previous Psych Diagnoses: Bipolar disorder, BPD, cannabis abuse Prior inpatient treatment: Twice.  Most recent in 11/25/2018-at [facility] due to suicidal thoughts by driving her car over a bridge Previous suicidal attempts: Tried to drive her car over the bridge -3 years ago Previous medication trials: Seroquel (upto 300 mg), hydroxyzine Current therapist: None  Past Medical History:  Past Medical History:  Diagnosis Date   ADHD    Axillary abscess 11/14/2012   Bipolar 1 disorder ([place])    Borderline personality disorder ([place]) 11/24/2018   Depression    GAD (generalized anxiety disorder) 05/18/2020   Suicidal ideation 11/24/2018    Past Surgical History:  Procedure Laterality Date   CHOLECYSTECTOMY N/A 07/30/2022   Procedure: LAPAROSCOPIC CHOLECYSTECTOMY WITH INTRAOPERATIVE CHOLANGIOGRAM;  Surgeon: Michael Boston, MD;  [facility]: [facility];  Service: General;  Laterality: N/A;   ERCP N/A 07/31/2022   Procedure: ENDOSCOPIC RETROGRADE CHOLANGIOPANCREATOGRAPHY (ERCP);  Surgeon: Carol Ada, MD;  Location: [facility];  Service: Gastroenterology;  Laterality: N/A;   REMOVAL OF STONES  07/31/2022   Procedure: REMOVAL OF STONES;  Surgeon: Carol Ada, MD;  Location: [facility];  Service: Gastroenterology;;   Joan Mayans  07/31/2022   Procedure: Joan Mayans;  Surgeon: Carol Ada, MD;  Location: [facility];  Service: Gastroenterology;;    Family Psychiatric History: Psych: Dad-mood swings was on  Seroquel.  SA/HA: Cousin-and himself Cousin's sister also committed suicide  Family History:  Family History  Problem Relation Age of Onset   COPD Mother    Hypertension Father     Social History:  Social History   Socioeconomic History   Marital status: Single    Spouse name: Not on file   Number of  children: Not on file   Years of education: Not on file   Highest education level: Not on file  Occupational History   Not on file  Tobacco Use   Smoking status: Never   Smokeless tobacco: Never   Tobacco comments:    reports that she stopped June 2020  Vaping Use   Vaping Use: Never used  Substance and Sexual Activity   Alcohol use: Yes    Comment: every weekend   Drug use: Yes    Types: Marijuana   Sexual activity: Yes    Birth control/protection: None  Other Topics Concern   Not on file  Social History Narrative   Not on file   Social Determinants of Health   Financial Resource Strain: High Risk (01/13/2023)   Overall Financial Resource Strain (CARDIA)    Difficulty of Paying Living Expenses: Very hard  Food Insecurity: No Food Insecurity (01/13/2023)   Hunger Vital Sign    Worried About Running Out of Food in the Last Year: Never true    [place] in the Last Year: Never true  Transportation Needs: No Transportation Needs (01/13/2023)   PRAPARE - Hydrologist (Medical): No    Lack of Transportation (Non-Medical): No  Physical Activity: Inactive (01/13/2023)   Exercise Vital Sign    Days of Exercise per Week: 0 days    Minutes of Exercise per Session: 0 min  Stress: Stress Concern Present (01/13/2023)   [place]    Feeling of Stress : Very much  Social Connections: Moderately Isolated (01/13/2023)   Social Connection and Isolation Panel [NHANES]    Frequency of Communication with Friends and Family: More than three times a week    Frequency of Social Gatherings with Friends and Family: More than three times a week    Attends Religious Services: 1 to 4 times per year    Active Member of [employer] or Organizations: No    Attends Music therapist: Never    Marital Status: Never married    Allergies: No Known Allergies  Metabolic Disorder Labs: No results found  for: "HGBA1C", "MPG" No results found for: "PROLACTIN" No results found for: "CHOL", "TRIG", "HDL", "CHOLHDL", "VLDL", "[place]" Lab Results  Component Value Date   TSH 1.214 09/15/2010    Therapeutic Level Labs: No results found for: "LITHIUM" No results found for: "VALPROATE" No results found for: "CBMZ"  Current Medications: Current Outpatient Medications  Medication Sig Dispense Refill   amoxicillin-clavulanate (AUGMENTIN) 500-125 MG tablet Take 1 tablet by mouth every 8 (eight) hours. 21 tablet 0   ibuprofen (ADVIL) 400 MG tablet Take 1-2 tablets (400-800 mg total) by mouth every 6 (six) hours as needed for headache, fever, mild pain, moderate pain or cramping (pain). 40 tablet 1   QUEtiapine (SEROQUEL) 100 MG tablet Take 1 tablet (100 mg total) by mouth at bedtime. 30 tablet 2   traMADol (ULTRAM) 50 MG tablet Take 1-2 tablets (50-100 mg total) by mouth every 6 (six) hours as needed for moderate pain or severe pain. (Patient not taking: Reported  on 08/23/2022) 20 tablet 0   No current facility-administered medications for this visit.     Musculoskeletal: Strength & Muscle Tone: within normal limits Gait & Station: normal Patient leans: N/A  Psychiatric Specialty Exam: Review of Systems  Blood pressure 138/84, pulse 77, SpO2 100 %.There is no height or weight on file to calculate BMI.  General Appearance: Casual  Eye Contact:  Good  Speech:  Clear and Coherent and Normal Rate  Volume:  Normal  Mood:  Euthymic  Affect:  Appropriate and Congruent  Thought Process:  Goal Directed and Linear  Orientation:  Full (Time, Place, and Person)  Thought Content: Logical   Suicidal Thoughts:  No  Homicidal Thoughts:  No  Memory:  intact  Judgement:  Fair  Insight:  Fair  Psychomotor Activity:  Normal  Concentration:  Concentration: Good and Attention Span: Good  Recall:  Good  Fund of Knowledge: Good  Language: Good  Akathisia:  No  Handed:    AIMS (if indicated): not done   Assets:  Communication Skills Desire for Improvement Housing Resilience Social Support  ADL's:  Intact  Cognition: WNL  Sleep:  Good   Screenings: AUDIT    Health and safety inspector from 01/13/2023 in [facility]  Alcohol Use Disorder Identification Test Final Score (AUDIT) 6      GAD-7    Flowsheet Row Counselor from 01/13/2023 in [facility]  Total GAD-7 Score 21      PHQ2-9    Flowsheet Row Counselor from 01/13/2023 in [place]  PHQ-2 Total Score 6  PHQ-9 Total Score 16      Flowsheet Row Counselor from 01/13/2023 in [facility] ED from 12/22/2022 in [facility] ED from 11/03/2022 in [facility] Emergency Department at [place] No Risk No Risk        Assessment and Plan: Patient is a 32 year old female with past psychiatric history of bipolar 2 disorder, borderline personality disorder, cannabis abuse presented to [facility] outpatient clinic for medication management follow-up.  She reports improvement in manic symptoms, depression, irritability, sleep, mood swings and anxiety.  She reports that she still has episodes when she gets angry but not as severe as before.  Will increase Seroquel to help with irritability and anger.  Patient is also reporting increase in her food cravings and appetite and reporting increase in 5 pounds weight.  Recommend monitoring weight gain, eating healthy and exercising regularly.  Will consider switching to Abilify if it continues to be an issue.   Bipolar 2 disorder, current episode depressed Borderline personality disorder by history -Increase Seroquel to 100 mg nightly to help with mood stabilization.  EKG last done on 08/13/2022-QTc 435 with heart rate 109.   Cannabis abuse -Recommend complete cessation   Follow up in 6 weeks.   Collaboration of Care:  Collaboration of Care: Other None  Patient/Guardian was advised Release of Information must be obtained prior to any record release in order to collaborate their care with an outside provider. Patient/Guardian was advised if they have not already done so to contact the registration department to sign all necessary forms in order for [place] to release information regarding their care.   Consent: Patient/Guardian gives verbal consent for treatment and assignment of benefits for services provided during this visit. Patient/Guardian expressed understanding and agreed to proceed.    Armando Reichert, MD 01/20/2023, 2:53  PM

## 2023-02-12 ENCOUNTER — Ambulatory Visit (HOSPITAL_COMMUNITY): Payer: Medicaid Other | Admitting: Mental Health

## 2023-02-19 ENCOUNTER — Telehealth (HOSPITAL_COMMUNITY): Payer: Self-pay | Admitting: Mental Health

## 2023-02-19 ENCOUNTER — Ambulatory Visit (HOSPITAL_COMMUNITY): Payer: Medicaid Other | Admitting: Mental Health

## 2023-02-19 NOTE — Telephone Encounter (Signed)
Therapist received message from [facility] OP RN that pt left message reporting to be running late for appointment with therapist. Therapist contact pt and rescheduled appointment and educated on therapist walk in hours.

## 2023-03-13 ENCOUNTER — Encounter (HOSPITAL_COMMUNITY): Payer: Medicaid Other | Admitting: Psychiatry

## 2023-03-18 ENCOUNTER — Encounter (HOSPITAL_COMMUNITY): Payer: Self-pay

## 2023-03-18 ENCOUNTER — Telehealth (INDEPENDENT_AMBULATORY_CARE_PROVIDER_SITE_OTHER): Payer: Medicaid Other | Admitting: Psychiatry

## 2023-03-18 DIAGNOSIS — F3181 Bipolar II disorder: Secondary | ICD-10-CM | POA: Diagnosis not present

## 2023-03-18 MED ORDER — QUETIAPINE FUMARATE 200 MG PO TABS: 200.0000 mg | ORAL_TABLET | Freq: Every day | ORAL | 2 refills | Status: AC

## 2023-03-18 NOTE — Progress Notes (Addendum)
BH MD/PA/NP OP Progress Note  03/18/2023 10:16 AM Angela [name]  MRN:  [id number]  Virtual Visit via Video Note  I connected with Angela [name] on 03/18/23 at 10:00 AM EDT by a video enabled telemedicine application and verified that I am speaking with the correct person using two identifiers.  Location: Patient: Home Provider: Clinic   I discussed the limitations of evaluation and management by telemedicine and the availability of in person appointments. The patient expressed understanding and agreed to proceed. I discussed the assessment and treatment plan with the patient. The patient was provided an opportunity to ask questions and all were answered. The patient agreed with the plan and demonstrated an understanding of the instructions.   The patient was advised to call back or seek an in-person evaluation if the symptoms worsen or if the condition fails to improve as anticipated.  I provided 20 minutes of non-face-to-face time during this encounter.   Karsten Ro, MD  Chief Complaint:  Chief Complaint  Patient presents with   Follow-up   HPI: Patient is a 32 year old female with past psychiatric history of bipolar 2 disorder, borderline personality disorder, cannabis abuse seen virtually  for medication management follow-up.  Pt reports that her mood is "depressed". She reports that she got to know recently that one of her aunt passed away due to cancer in 01-11-23.  She states that nobody told her until recently.  She states that when she got to know about her aunt's death, she started having mixed symptoms including high energy, pressured speech, mood swings, depression irritability and angry.  She states that it lasted for 1 week.  She is feeling better now but still feels depressed.  She denies any manic symptoms now.  She does get anxious sometimes.    She has been sleeping well but wakes up between 4 to 5 AM every day.  She smokes marijuana in the morning, watches videos and then  fall asleep.  She reports stable appetite.  She reports that she has lost some weight which she had gained initially.  Currently, she is not reporting any suicidal ideations, homicidal ideations, auditory and visual hallucinations or  paranoia.  She denies any medication side effects and has been tolerating it well.  She is not having any vivid dreams anymore.  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  She lost her previous job as she was missing a lot of days from work after her aunt passed away.  She denies drinking alcohol  and uses marijuana daily. Recommended complete cessation.  Discussed increasing Seroquel to help with bipolar depression and to prevent future manic episode.  She denies any other concerns.  Recommended to get HbA1c and lipid panel at [facility].  She agrees with the plan. Patient is alert and oriented x 4,  calm, cooperative, and fully engaged in conversation during the encounter.  Her thought process is linear with coherent speech . She does not appear to be responding to internal/external stimuli .     Manic symptoms Mood swings Wt gain Anger outburst New job at [employer]  Marijuana  Alcohol Seroquel increased  Lipid panel HBA1C EKG good Visit Diagnosis:    ICD-10-CM   1. Bipolar II disorder, severe, depressed, with anxious distress  F31.81 QUEtiapine (SEROQUEL) 200 MG tablet       Past Psychiatric History: Previous Psych Diagnoses: Bipolar disorder, BPD, cannabis abuse Prior inpatient treatment: Twice.  Most recent in 11/25/2018-at [facility] due to suicidal thoughts  by driving her car over a bridge Previous suicidal attempts: Tried to drive her car over the bridge -3 years ago Previous medication trials: Seroquel (upto 300 mg), hydroxyzine Current therapist: None  Past Medical History:  Past Medical History:  Diagnosis Date   ADHD    Axillary abscess 11/14/2012   Bipolar 1 disorder (HCC)    Borderline personality disorder (HCC) 11/24/2018    Depression    GAD (generalized anxiety disorder) 05/18/2020   Suicidal ideation 11/24/2018    Past Surgical History:  Procedure Laterality Date   CHOLECYSTECTOMY N/A 07/30/2022   Procedure: LAPAROSCOPIC CHOLECYSTECTOMY WITH INTRAOPERATIVE CHOLANGIOGRAM;  Surgeon: Karie Soda, MD;  Location[facility];  Service: General;  Laterality: N/A;   ERCP N/A 07/31/2022   Procedure: ENDOSCOPIC RETROGRADE CHOLANGIOPANCREATOGRAPHY (ERCP);  Surgeon: Jeani Hawking, MD;  Location: [facility];  Service: Gastroenterology;  Laterality: N/A;   REMOVAL OF STONES  07/31/2022   Procedure: REMOVAL OF STONES;  Surgeon: Jeani Hawking, MD;  Location: [facility];  Service: Gastroenterology;;   Dennison Mascot  07/31/2022   Procedure: Dennison Mascot;  Surgeon: Jeani Hawking, MD;  Location: [facility];  Service: Gastroenterology;;    Family Psychiatric History: Psych: Dad-mood swings was on Seroquel.  SA/HA: Cousin-and himself Cousin's sister also committed suicide  Family History:  Family History  Problem Relation Age of Onset   COPD Mother    Hypertension Father     Social History:  Social History   Socioeconomic History   Marital status: Single    Spouse name: Not on file   Number of children: Not on file   Years of education: Not on file   Highest education level: Not on file  Occupational History   Not on file  Tobacco Use   Smoking status: Never   Smokeless tobacco: Never   Tobacco comments:    reports that she stopped June 2020  Vaping Use   Vaping Use: Never used  Substance and Sexual Activity   Alcohol use: Yes    Comment: every weekend   Drug use: Yes    Types: Marijuana   Sexual activity: Yes    Birth control/protection: None  Other Topics Concern   Not on file  Social History Narrative   Not on file   Social Determinants of Health   Financial Resource Strain: High Risk (01/13/2023)   Overall Financial Resource Strain (CARDIA)    Difficulty of Paying Living Expenses: Very hard   Food Insecurity: No Food Insecurity (01/13/2023)   Hunger Vital Sign    Worried About Running Out of Food in the Last Year: Never true    Ran Out of Food in the Last Year: Never true  Transportation Needs: No Transportation Needs (01/13/2023)   PRAPARE - Administrator, Civil Service (Medical): No    Lack of Transportation (Non-Medical): No  Physical Activity: Inactive (01/13/2023)   Exercise Vital Sign    Days of Exercise per Week: 0 days    Minutes of Exercise per Session: 0 min  Stress: Stress Concern Present (01/13/2023)   [employer] of Occupational Health - Occupational Stress Questionnaire    Feeling of Stress : Very much  Social Connections: Moderately Isolated (01/13/2023)   Social Connection and Isolation Panel [NHANES]    Frequency of Communication with Friends and Family: More than three times a week    Frequency of Social Gatherings with Friends and Family: More than three times a week    Attends Religious Services: 1 to 4 times per year  Active Member of Clubs or Organizations: No    Attends [employer]er[employer] or Organization Meetings: Never    Marital Status: Never married    Allergies: No Known Allergies  Metabolic Disorder Labs: No results found for: "HGBA1C", "MPG" No results found for: "PROLACTIN" No results found for: "CHOL", "TRIG", "HDL", "CHOLHDL", "VLDL", "LDLCALC" Lab Results  Component Value Date   TSH 1.214 09/15/2010    Therapeutic Level Labs: No results found for: "LITHIUM" No results found for: "VALPROATE" No results found for: "CBMZ"  Current Medications: Current Outpatient Medications  Medication Sig Dispense Refill   amoxicillin-clavulanate (AUGMENTIN) 500-125 MG tablet Take 1 tablet by mouth every 8 (eight) hours. 21 tablet 0   ibuprofen (ADVIL) 400 MG tablet Take 1-2 tablets (400-800 mg total) by mouth every 6 (six) hours as needed for headache, fever, mild pain, moderate pain or cramping (pain). 40 tablet 1   QUEtiapine (SEROQUEL) 200 MG  tablet Take 1 tablet (200 mg total) by mouth at bedtime. 30 tablet 2   traMADol (ULTRAM) 50 MG tablet Take 1-2 tablets (50-100 mg total) by mouth every 6 (six) hours as needed for moderate pain or severe pain. (Patient not taking: Reported on 08/23/2022) 20 tablet 0   No current facility-administered medications for this visit.     Musculoskeletal: Strength & Muscle Tone: Not able to assess due to virtual visit Gait & Station: Not able to assess due to virtual visit Patient leans: N/A  Psychiatric Specialty Exam: Review of Systems  There were no vitals taken for this visit.There is no height or weight on file to calculate BMI.  General Appearance: Casual  Eye Contact:  Good  Speech:  Clear and Coherent and Normal Rate  Volume:  Normal  Mood:  Anxious and Depressed  Affect:  Constricted  Thought Process:  Goal Directed and Linear  Orientation:  Full (Time, Place, and Person)  Thought Content: Logical   Suicidal Thoughts:  No  Homicidal Thoughts:  No  Memory:  intact  Judgement:  Fair  Insight:  Fair  Psychomotor Activity:  Normal  Concentration:  Concentration: Good and Attention Span: Good  Recall:  Good  Fund of Knowledge: Good  Language: Good  Akathisia:  virtual visit  Handed:    AIMS (if indicated): not done  Assets:  Communication Skills Desire for Improvement Housing Resilience Social Support  ADL's:  Intact  Cognition: WNL  Sleep:  Good   Screenings: AUDIT    Advertising copywriterlowsheet Row Counselor [facility]  Alcohol Use Disorder Identification Test Final Score (AUDIT) 6      GAD-7    Flowsheet Row Counselor [facility]  Total GAD-7 Score 21      PHQ2-9    Flowsheet Row Counselor [facility]  PHQ-2 Total Score 6  PHQ-9 Total Score 16      Flowsheet Row Counselor [facility]  [facility]  C-SSRS RISK CATEGORY Low Risk No Risk No Risk        Assessment and Plan: Patient is a 32 year old female with past psychiatric history of bipolar 2 disorder, borderline personality disorder, cannabis abuse [facility] for medication management follow-up. Patient reports worsening of her depression due to recent death of her aunt.  She had  hypomanic/manic episode recently that lasted for 1 week.  Will increase Seroquel for mood stabilization.  Recommend complete cessation of cannabis.   Bipolar 2 disorder, current episode depressed Borderline personality disorder by history -Increase Seroquel to 200 mg nightly to help with mood stabilization.  EKG last done on 08/13/2022-QTc 435 with heart rate 109.   Cannabis abuse -Recommend complete cessation   Follow up in 8 weeks.   Collaboration of Care: Collaboration of Care: Other None  Patient/Guardian was advised Release of Information must be obtained prior to any record release in order to collaborate their care with an outside provider. Patient/Guardian was advised if they have not already done so to contact the registration department to sign all necessary forms in order for [place] to release information regarding their care.   Consent: Patient/Guardian gives verbal consent for treatment and assignment of benefits for services provided during this visit. Patient/Guardian expressed understanding and agreed to proceed.    Karsten Ro, MD 03/18/2023, 10:16 AM

## 2023-03-27 ENCOUNTER — Telehealth (HOSPITAL_COMMUNITY): Payer: Self-pay | Admitting: Mental Health

## 2023-03-27 ENCOUNTER — Ambulatory Visit (HOSPITAL_COMMUNITY): Payer: Medicaid Other | Admitting: Mental Health

## 2023-03-27 ENCOUNTER — Encounter (HOSPITAL_COMMUNITY): Payer: Self-pay

## 2023-03-27 NOTE — Telephone Encounter (Signed)
Therapist sent link for tele-therapy session. No response after x 10 minutes. Contact pt via telephone; in which pt reported to be at work at this time. Rescheduled for 8am 5/20

## 2023-03-28 ENCOUNTER — Encounter (HOSPITAL_COMMUNITY): Payer: Self-pay | Admitting: Psychiatry

## 2023-04-28 ENCOUNTER — Telehealth (HOSPITAL_COMMUNITY): Payer: Self-pay | Admitting: Mental Health

## 2023-04-28 ENCOUNTER — Ambulatory Visit (HOSPITAL_COMMUNITY): Payer: Medicaid Other | Admitting: Mental Health

## 2023-04-28 ENCOUNTER — Encounter (HOSPITAL_COMMUNITY): Payer: Self-pay

## 2023-04-29 NOTE — Telephone Encounter (Signed)
Therapist contacted pt to follow up on therapist cancellation on 04/28/23. Therapist apologized offered to reschedule pt and attempt to work in sooner in event of cancellation or NS. When provided date of appointment pt became upset and reported for therapist to have cancelled on her x 2 in the past. Review of chart indicates for pt to have no showed 01/20/23; 02/19/23 and 03/27/23 with chart indicating therapist first need to cancel on pt 04/28/23. Pt stated it was a shame she had to be scheduled x 1 month out and that therapist should be checking on her. Pt then disconnected the call.

## 2024-09-13 NOTE — Progress Notes (Signed)
 Assessment and Care Plan   1. Class 3 severe obesity due to excess calories with serious comorbidity and body mass index (BMI) of 40.0 to 44.9 in adult (*) (Primary) 2. Dietary counseling and surveillance 3. Exercise counseling Prabhnoor is here for initial visit for weight management.   Discussed weight, diet, and exercise with patient in relation to health conditions. Explored patient's motivation level, barriers, previous attempts, and goals.  Chloie completed a body composition exercise and was provided a printed report which we reviewed. We discussed resting metabolic rate, BMI, body fat percentage and visceral fat.   Labs within 6 months were reviewed. Additional labs ordered today include None- all labs up to date  Reviewed current medications and medical conditions, and identified those that can promote weight gain. Past or present use of medications that may cause weight gain includes antipsychotics - Seroquel . She is no longer taking it.   Discussed the indications for, risks of and benefits of bariatric surgery. Anaysha is a candidate for bariatric surgery by BMI but is likely not a good candidate due to her psychiatric history. Will start working within our program and could reconsider later.   Discussed medications that can help with weight loss. no weight loss medication should be taken until a healthier eating pattern is established. Can consider in the future.   Discussed [facility] program structure with four pillars including medical, nutrition, fitness and behavioral, and the services available within each of these disciplines. Tinesha was informed of the importance of frequent follow-up visits to maximize success with intensive lifestyle modifications. Over the next few weeks, we will develop an individualized care plan specific to their BMI, comorbidities, goals and resources.   Resting metabolic rate testing was not completed today.   Body mass index is 40.3 kg/m.  Long  term goal: 180-190 lbs  Short term 5% goal: 12 lbs  Current weight loss medications: none   [facility] Weight History: 1. 09/13/24 242.2 lbs  - As a part of the [facility] program, as referred by MD, pt will receive MNT (Medical Nutrition Therapy) -She will complete a Movement Consult with Fitness Specialist  -She was offered a behavioral health assessment. She is interested so a referral was placed for [facility].   Goals set for next appointment include:              1. Begin logging food and beverage intake in preparation for next appointment.              2. Increase water intake with goal of 64 oz water daily 3. Begin to work on increasing exercise, incorporating cardio and resistance training with long-term goal of 150 minutes weekly. Short term goal of adding a few walks a week pushing baby in stroller. Take standing/movement breaks throughout the work day.   Pt has the following obesity related comorbidities which were reviewed but not discussed today, considered in medical decision making, and will be further addressed at subsequent visit: Hyperlipidemia, Anxiety, and Depression. We will provide disease specific education in these areas and will discuss the impact that 5-10% weight loss will have on health conditions.  4. History of bipolar disorder She has a history of bipolar disorder with past manic episodes. She is not currently on medication for bipolar disorder. She stopped her meds due to pregnancy and has not resumed. Pt advised to follow up with her treating provider fro medication management.  She agrees to work with our [employer] services for therapy.  - Ambulatory  referral to Psychotherapy/Counseling; Future - Ambulatory referral to Psychotherapy/Counseling  5. History of eating disorder She has a history of bulimia and anorexia contributing to her current eating habits. She be evaluated by our behavioral health therapist as to her suitability to work on  weight loss.  We will address her issues and develop healthier eating patterns. She will also work with our registered dietitian on focusing healthy eating patterns.  Assessment & Plan    Follow up in about 3 weeks (around 10/05/2024) for weight management, dietician evaluation, [facility] therapist, fitness consult.  Documentation for time-based billing:  Total time spent of date of service was 60 minutes.  Patient care activities included preparing to see the patient such as reviewing the patient record, obtaining and/or reviewing separately obtained history, performing a medically appropriate history and physical examination, counseling and educating the patient, family, and/or caregiver, referring and communicating with other health care providers when not separately reported during the visit, documenting clinical information in the electronic or other health record, independently interpreting results when not separately reported, and communicating results to the patient/family/caregiver.    Subjective   Chief Complaint  Patient presents with  . Weight Management    New patient    Lovely Kerins is a 33 y.o. female who is a patient of Wanna Carlin KATHEE PONCE SHAUNNA* presenting for a new office visit to address obesity management and its related health conditions.  Obesity History & History of Weight Loss Efforts  History of Present Illness  Motivated by dissatisfaction with body image and having a hard time being active with her 33 month old baby. She has a history of bulimia requiring hospitalization and therapy at age 43, and was diagnosed with anorexia. She Family issues contributed to her self-consciousness.   She has postpartum weight loss concerns; her highest pregnancy weight was 287 lbs, and her current weight is 242 lbs.  She reports over-restricting her diet, consuming large amounts of water and ice. She does not currently have a therapist but had one prior to pregnancy.   As a  single mother working from home, she struggles to balance work and childcare.  She aims for a weight of 180-190 lbs.  She has used calorie tracking apps with a daily limit of 1300 calories, often falling short. She had gestational diabetes requiring calorie and carbohydrate monitoring.  She has texture issues with certain foods but enjoys fruits like bananas, grapes, strawberries, mangoes, oranges, and tangerines.  Her diet varies, sometimes consisting of one meal and snacks, and other days only snacks. She consumes steak, baked potatoes, and mac and cheese, which she feels are sufficient for her daily caloric needs.  She likes eggs on salad and eats them for breakfast when she eats breakfast. She enjoys sweet peppers dipped in dressing. She drinks a lot of water and only occasional soda or sweet tea.   She enjoys dancing for her physical activity and incorporates it into her routine. She dances 4 days a week and walks with her baby once a week.   She has never taken weight loss medication. She has taken vyvanse and Adderall for ADHD.   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  She sleeps 5 hours a night, interrupted by her baby.   She has a history of ADHD and bipolar disorder, with no manic episodes while on stimulants. She had several manic episodes before having her child and was hospitalized  for bipolar disorder, depression, and anxiety in the past. She has had past suicidal ideation, but not recently. She is not on medication for bipolar depression. She stopped seeing her therapist when pregnant and was taken off Seroquel  300 mg.  Marital Status: Single, She is no longer breastfeeding and lives alone with her baby. 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   Mental Health   DEPRESSION SCREENING: PHQ9      09/14/2024  Depression Screen  Please choose the category that best describes the patient's current state 1  Not eligible on the basis of Not applicable  1. Little interest or pleasure in doing things 0  2. Feeling down, depressed, or hopeless 2  PHQ-2 Total Score 2  PHQ-2 Interpretation of Score for Depression (Payor) Negative  3. Trouble falling or staying asleep 0  4. Feeling tired or having little energy 1  5. Poor appetite or overeating 2  6. Feeling bad about yourself - or that you are a failure or have let yourself or your family down 1  7. Trouble concentrating on things, such as reading the newspaper or watching television 0  8. Moving or speaking so slowly that other people could have noticed.  Or the opposite - being so fidgety or restless that you have been moving around a lot more than usual. 1  9. Thoughts that you would be better off dead, or of hurting yourself in some way. 0  10. How difficult have these problems made it for you to do your work, take care of things at home or get along with other people? Somewhat difficult  PHQ Total Score 7  Interpretation: Mild Depression, repeat at follow-up  PHQ-9 Interpretation of Score for Depression (Payor) Negative    Score was reviewed and discussed with patient.   SLEEP SCREENING: She sleeps 5 hours per night. She has not had a sleep study. She does not wear CPAP each night.    Snoring: Do you snore loudly?    N  Tired: Do you often feel tired, fatigued, or sleepy during the day?    N  Observed: Has anyone observed that you stop breathing during your sleep?    N  Blood pressure: Have you ever been treated for high blood pressure?     N  BMI more than 35 kg/m    Y  Age  over 50 years    N  Neck circumference- Female 17 inches or greater  Female 16 inches or greater    N  Gender, female    N  SCORE    y   SLEEP MEDICINE CONSULT no  Review of Systems  Constitutional:  Negative for activity change, appetite change and unexpected weight change.  Eyes:        History of Glaucoma - Denies  Respiratory:  Negative for shortness of breath.   Cardiovascular:  Negative for chest pain, palpitations and leg swelling.       History of MI  - Denies  Gastrointestinal:  Negative for abdominal pain, diarrhea, nausea and vomiting.       History of gastroparesis - Denies History of pancreatitis - Denies History of gallbladder  disease - Has had cholecystectomy    Endocrine: Negative for cold intolerance, heat intolerance, polydipsia, polyphagia and polyuria.       History of diabetes - hx of gestational DM.  History or family history of thyroid cancer  - Denies History of MEN2  - Denies  Genitourinary:        History of kidney stones - Denies  Musculoskeletal:  Negative for arthralgias, back pain and gait problem.  Allergic/Immunologic: Negative for food allergies.  Neurological:  Negative for seizures.       History of seizures - Denies   Psychiatric/Behavioral:  Negative for suicidal ideas. The patient is not nervous/anxious.        Depression  - yes, Bipolar Anxiety - yes ADHD    EKG:  Hospital Encounter on 11/24/18  ECG 12 lead   Narrative   Diagnosis Class Normal Acquisition Device MAC55 Ventricular Rate 76 Atrial Rate 76 P-R Interval 152 QRS Duration 94 Q-T Interval 402 QTC Calculation(Bazett) 452 Calculated P Axis 35 Calculated R Axis 57 Calculated T Axis 11  Diagnosis normal sinus at 776 bpm normal axis noted Nonspecific ST-T changes Dery, John ([id number]) on 11/25/2018 3:54:46 PM certifies that he/she has reviewed the ECG tracing and confirms the independent  interpretation is correct.    Last Labs: Lab Results  Component Value Date   WBC 9.9  07/23/2024   Hemoglobin 10.8 (L) 07/23/2024   Hematocrit 35.7 07/23/2024   Platelet Count 382 07/23/2024   Cholesterol, Total 199 07/23/2024   Triglycerides 89 07/23/2024   HDL 41 07/23/2024   LDL 142 (H) 07/23/2024   ALT (SGPT) 19 07/23/2024   AST 15 07/23/2024   Sodium 138 07/23/2024   Potassium 4.3 07/23/2024   Chloride 106 07/23/2024   Creatinine 0.75 07/23/2024   BUN 6 07/23/2024   CO2 19 (L) 07/23/2024   TSH 0.834 07/23/2024   Glucose 96 07/23/2024   Hemoglobin A1c 5.4 07/23/2024     Reviewed and updated this visit by provider: Tobacco  Allergies  Meds  Problems  Med Hx  Surg Hx  Fam Hx        Objective   BP 119/74 (Patient Position: Sitting)   Pulse 80   Resp 16   Ht 5' 5 (1.651 m)   Wt 242 lb 3.2 oz (109.9 kg)   SpO2 97%   BMI 40.30 kg/m  Wt Readings from Last 3 Encounters:  09/14/24 242 lb 3.2 oz (109.9 kg)  07/23/24 248 lb 9.6 oz (112.8 kg)  05/16/20 233 lb 6.4 oz (105.9 kg)    Measurements  Body Measurements  More data may exist     09/14/2024  Body Measurements  Abdominal Girth (inches) 46.5  Neck (cm) 36.5      FIB-4 Score FIB-4 liver fibrosis score: Unavailable  Interpretation: The FIB-4 score cannot be calculated on a patient younger than 31 years old.    Body Composition See scanned document under Media Tab  Physical Exam Constitutional:      General: She is not in acute distress.    Appearance: Normal appearance. She is obese. She is not ill-appearing.  Cardiovascular:     Rate and Rhythm: Normal rate and regular rhythm.  Pulmonary:     Effort: Pulmonary effort is normal.     Breath sounds: Normal breath sounds.  Abdominal:     Palpations: Abdomen is soft.  Skin:    General: Skin is warm and dry.  Neurological:     General: No focal deficit  present.     Mental Status: She is alert and oriented to person, place, and time.  Psychiatric:        Mood and Affect: Mood normal.        Behavior: Behavior normal.         Thought Content: Thought content normal.        Judgment: Judgment normal.

## 2024-10-19 NOTE — Progress Notes (Signed)
 [facility] Video Visit  Video Visit: Appointment conducted with the use of a HIPAA compliant video camera/computer monitor. Vital signs, current body weight, and other aspects of appointment are limited due to the nature of this encounter. Patient has been advised as to the limitations due to nature of a video visit, the possibility of privacy risk in the use of a video visit, and that the healthcare provider may recommend visiting a healthcare clinic for in-person care and follow up. Verbal consent for this appointment was received. Place of service: location other than patient home  Time start: 9:12 AM   Initial [facility] Weight (09/14/24): 242.2 lbs Initial [facility] BMI: 40.3   Today's Weight: N/A VV  Today's BMI: N/A    Initial 5% weight loss goal (-12 lbs)  Longterm wt goal: 180-190 lbs   Wt Readings from Last 3 Encounters:  09/14/24 242 lb 3.2 oz (109.9 kg)  07/23/24 248 lb 9.6 oz (112.8 kg)  05/16/20 233 lb 6.4 oz (105.9 kg)   Objective: Vitals: There were no vitals filed for this visit.  Medications: Medications Ordered Prior to Encounter[1]  Allergies: Allergies[2]  Labs:  Lab Results  Component Value Date   WBC 9.9 07/23/2024   Hemoglobin 10.8 (L) 07/23/2024   Hematocrit 35.7 07/23/2024   Platelet Count 382 07/23/2024   Cholesterol, Total 199 07/23/2024   Triglycerides 89 07/23/2024   HDL 41 07/23/2024   LDL 142 (H) 07/23/2024   ALT (SGPT) 19 07/23/2024   AST 15 07/23/2024   Sodium 138 07/23/2024   Potassium 4.3 07/23/2024   Chloride 106 07/23/2024   BUN 6 07/23/2024   CO2 19 (L) 07/23/2024   TSH 0.834 07/23/2024   Glucose 96 07/23/2024   Hemoglobin A1c 5.4 07/23/2024    Co-morbidities:  Problem List[3]  Assessment:  Motivation: to lose wt; pt said, I hate looking in the mirror, I hate the way I look naked, I am just so fat, I hate it.   Accessibility to food: some concern right now - due to the government shut-down, b/c of the cut in EBT/benefits.  Food  Allergies: none   Other Dietary Restrictions / Intolerances: none   Current Weight Loss Medication(s): none     Supplements: none    Occupation & Level of Activity at work: Works remotely 8am-7pm; sedentary    Current physical activity habits/exercise: none    Logging food intakes: not logging food intakes but willing to discuss / try logging   Typical Intake Pattern: 1 meal & 2-3 snacks per day  Pt eats away from home about 1-2 times per week. Resides with her 58month old son.  Pt usually does grocery shopping & the cooking at home.    Likes: plain lettuce, green beans   Dislikes: broccoli steams (will only eat the very, very green part of the brocoli)  24-hours Recall:   Wakes: 5-6am   Breakfast:  11am:  crackers, OR eggs & rice w/ tomatoes  Lunch: --- Snack: 2 granola bars, 1 propel (Sugar-free)  Dinner: 10-11pm: [employer] roast beef slider combo w/ fries, & Regular Mellow Yellow soda  Snack: 12pm: 1 full-size Twix candy bar  Bedtime: 1am   Water intake: drinks ~32 oz/day; but Loves to eat ice throughout the day (said, I am addicted to ice, I love ice)   Other Beverages/Caffeine: propel waters (usually Sugar-free); Regular Sodas (? per day or week); cranberry or apple juice (every 2-3 days); Sweet Tea (occasionally)   Alcohol: occasionally   Individual Nutrition  Recommendations:  [REE: 1872 kcal/day SECA 09/14/24]  Calories 1500 kcal/day range  Protein (30%) 112 grams/day Carbohydrates (40%) 150 grams/day Fat (30%) 50 grams/day Fiber 25-30grams/day  Water 64oz/day Exercise 170minutes/week Sleep 7-9hours   Discussion with patient & Education provided r/t nutrition plan:   RD collected assessment data and explained process of goal setting and reviewing progress through regular follow-up visits.    Pt reports she miscarried in October (which she says caused her to over-indulge in candy & sweets). Gave son & daughter up when she was a teenager (her cousin & her husband  have full custody of them but they know the pt is their bio-mom).  She was sitting in her car outside of her home during visit; she said she had to end the visit early today b/c she needed to go inside and see to her son.    RD did not get to discuss BMR and body composition result with pt at this visit due to pt ending it early - will go over it at first follow-up visit. Provided education on healthy weight loss of 0.5 to 2 lbs/week for goal setting. Timing recommendations: eat within an hour of getting up, eat every ~4 hours throughout the day, do not eat within 2 hours of going to bed.  Encouraged patient to eat a protein source whenever there is a carbohydrate consumed. This will slow overall digestion and help to prevent blood sugar spikes while keeping the patient full and satisfied for a longer period of time. Examples include: apple/banana with peanut butter, crackers with cheese, fruit with cottage cheese and Greek yogurt with granola. Encouraged benefits of exercise to the pt and made sure pt aware of the option for a Fitness consult here at [facility] with the exercise specialist.  RD offered encouragement to pt while using motivational interviewing to elicit positive lifestyle changes. Pt verbalized understanding and was agreeable to the goals set at this visit.   Goals & Handouts provided to patient via email after visit: Nutrition Journey Sheet; My Healthy Plate, Green/Yellow/Red Food List    Initial Goals:  1.) Work on data processing manager plates at [employer]   2.) Look at logging app options discussed/provided & try to start logging intakes of food/beverages for next appointment    End Time: 9:46 AM         [1] Current Outpatient Medications on File Prior to Visit  Medication Sig Dispense Refill  . albuterol  sulfate HFA (PROVENTIL ,VENTOLIN ,PROAIR ) 108 (90 Base) MCG/ACT inhaler Inhale one puff to two puffs into the lungs every 6 (six) hours as needed for Wheezing or Shortness of Breath.  18 g 0  . ergocalciferol (VITAMIN D, ERGOCALCIFEROL,) 50,000 units CAPS capsule Take one capsule (50,000 Units dose) by mouth every 7 (seven) days. 12 capsule 1  . ESTARYLLA 0.25-35 MG-MCG per tablet Take one tablet by mouth daily.     No current facility-administered medications on file prior to visit.  [2] No Known Allergies [3] Patient Active Problem List Diagnosis  . Bipolar disease in pregnancy (*)  . History of personality disorder  . History of ADHD  . Hx MRSA infection  . Positive urine drug screen  . Borderline personality disorder (*)  . Suicidal ideation  . Bipolar II disorder, severe, depressed, with anxious distress (*)  . Mild cannabis use disorder  . Pelvic pain in female  . Menometrorrhagia  . GAD (generalized anxiety disorder)  . Hyperlipidemia  . Vitamin D deficiency

## 2024-11-04 ENCOUNTER — Other Ambulatory Visit: Payer: Self-pay

## 2024-11-04 ENCOUNTER — Encounter (HOSPITAL_BASED_OUTPATIENT_CLINIC_OR_DEPARTMENT_OTHER): Payer: Self-pay

## 2024-11-04 ENCOUNTER — Emergency Department (HOSPITAL_BASED_OUTPATIENT_CLINIC_OR_DEPARTMENT_OTHER)
Admission: EM | Admit: 2024-11-04 | Discharge: 2024-11-04 | Disposition: A | Discharge status: Home / Self Care | Payer: MEDICAID | Attending: Emergency Medicine | Admitting: Emergency Medicine

## 2024-11-04 ENCOUNTER — Emergency Department (HOSPITAL_BASED_OUTPATIENT_CLINIC_OR_DEPARTMENT_OTHER): Payer: MEDICAID

## 2024-11-04 DIAGNOSIS — R059 Cough, unspecified: Secondary | ICD-10-CM | POA: Diagnosis present

## 2024-11-04 DIAGNOSIS — O24419 Gestational diabetes mellitus in pregnancy, unspecified control: Secondary | ICD-10-CM

## 2024-11-04 DIAGNOSIS — J069 Acute upper respiratory infection, unspecified: Secondary | ICD-10-CM | POA: Insufficient documentation

## 2024-11-04 HISTORY — DX: Gestational diabetes mellitus in pregnancy, unspecified control: O24.419

## 2024-11-04 MED ORDER — IPRATROPIUM-ALBUTEROL 0.5-2.5 (3) MG/3ML IN SOLN
3.0000 mL | Freq: Once | RESPIRATORY_TRACT | Status: AC | PRN
Start: 2024-11-04 — End: 2024-11-04
  Administered 2024-11-04: 3 mL via RESPIRATORY_TRACT
  Filled 2024-11-04: qty 3

## 2024-11-04 MED ORDER — BENZONATATE 100 MG PO CAPS: 100.0000 mg | ORAL_CAPSULE | Freq: Three times a day (TID) | ORAL | 0 refills | Status: AC | PRN

## 2024-11-04 MED ORDER — BENZONATATE 100 MG PO CAPS
200.0000 mg | ORAL_CAPSULE | Freq: Once | ORAL | Status: AC
  Administered 2024-11-04: 200 mg via ORAL
  Filled 2024-11-04: qty 2

## 2024-11-04 NOTE — ED Triage Notes (Signed)
 Pt sob on arrival. States I can't breathe. Pt tearful and anxious. Says she has a hx of asthma and was exposed to rhinovirus.

## 2024-11-04 NOTE — ED Notes (Signed)
 ED Provider at bedside.

## 2024-11-04 NOTE — ED Provider Notes (Signed)
 [facility] Provider Note   CSN: [id number] Arrival date & time: 11/04/24  1704     Patient presents with: Shortness of Breath   Angela [name] is a 33 y.o. female presented to ED with complaint of congestion, cough, shortness of` ongoing for 3 days.  Patient reports that she has a history of asthma.  She reports she has an albuterol  pump but is not helping her at home.  She reports that she has been near a sick contact who was hospitalized with  rhinovirus 2 days ago.  She reports he does smoke Black and milds but says she has not smoked in the past 3 days specifically   HPI     Prior to Admission medications   Medication Sig Start Date End Date Taking? Authorizing Provider  benzonatate  (TESSALON ) 100 MG capsule Take 1 capsule (100 mg total) by mouth 3 (three) times daily as needed for up to 21 doses for cough. 11/04/24  Yes Cottie Donnice PARAS, MD  amoxicillin -clavulanate (AUGMENTIN ) 500-125 MG tablet Take 1 tablet by mouth every 8 (eight) hours. 11/03/22   Geroldine Berg, MD  ibuprofen  (ADVIL ) 400 MG tablet Take 1-2 tablets (400-800 mg total) by mouth every 6 (six) hours as needed for headache, fever, mild pain, moderate pain or cramping (pain). 08/01/22   Sheldon Standing, MD  QUEtiapine  (SEROQUEL ) 200 MG tablet Take 1 tablet (200 mg total) by mouth at bedtime. 03/18/23   Doda, Vandana, MD  traMADol  (ULTRAM ) 50 MG tablet Take 1-2 tablets (50-100 mg total) by mouth every 6 (six) hours as needed for moderate pain or severe pain. Patient not taking: Reported on 08/23/2022 08/01/22   Sheldon Standing, MD    Allergies: Patient has no known allergies.    Review of Systems  Updated Vital Signs BP 138/83 (BP Location: Right Arm)   Pulse 86   Temp 98.7 F (37.1 C) (Oral)   Resp 16   Ht 5' 5 (1.651 m)   Wt 104.3 kg   LMP 10/05/2024 (Approximate)   SpO2 100%   BMI 38.27 kg/m   Physical Exam Constitutional:      General: She is not in acute  distress.    Appearance: She is obese.  HENT:     Head: Normocephalic and atraumatic.  Eyes:     Conjunctiva/sclera: Conjunctivae normal.     Pupils: Pupils are equal, round, and reactive to light.  Cardiovascular:     Rate and Rhythm: Normal rate and regular rhythm.  Pulmonary:     Effort: Pulmonary effort is normal. No respiratory distress.     Breath sounds: No wheezing.     Comments: Dry cough Abdominal:     General: There is no distension.     Tenderness: There is no abdominal tenderness.  Skin:    General: Skin is warm and dry.  Neurological:     General: No focal deficit present.     Mental Status: She is alert. Mental status is at baseline.  Psychiatric:        Mood and Affect: Mood normal.        Behavior: Behavior normal.     (all labs ordered are listed, but only abnormal results are displayed) Labs Reviewed - No data to display  EKG: EKG Interpretation Date/Time:  Thursday November 04 2024 17:17:15 EST Ventricular Rate:  90 PR Interval:  149 QRS Duration:  98 QT Interval:  378 QTC Calculation: 463 R Axis:   49  Text  Interpretation: Sinus rhythm Borderline T abnormalities, anterior leads Confirmed by Cottie Cough [phone]) on 11/04/2024 5:18:08 PM  Radiology: DG Chest Portable 1 View Result Date: 11/04/2024 EXAM: 1 VIEW(S) XRAY OF THE CHEST 11/04/2024 05:39:00 PM COMPARISON: 12/05/2022 and earlier. CLINICAL HISTORY: 32 year old female with shortness of breath. FINDINGS: LUNGS AND PLEURA: No focal pulmonary opacity. No pleural effusion. No pneumothorax. HEART AND MEDIASTINUM: No abnormality of the cardiac and mediastinal silhouettes. BONES AND SOFT TISSUES: No acute osseous abnormality. IMPRESSION: 1. Negative portable chest. Electronically signed by: Helayne Hurst MD 11/04/2024 05:42 PM EST RP Workstation: [id number]     Procedures   Medications Ordered in the ED  benzonatate  (TESSALON ) capsule 200 mg (has no administration in time range)   ipratropium-albuterol  (DUONEB) 0.5-2.5 (3) MG/3ML nebulizer solution 3 mL (3 mLs Nebulization Given 11/04/24 1732)                                    Medical Decision Making Amount and/or Complexity of Data Reviewed Radiology: ordered.  Risk Prescription drug management.   Patient is here with persistent dry cough for 3 days.  She has no hypoxia, no tachypnea, she is speaking full sentences.  No significant wheezing on exam.  There is a possibility of mild bronchospasm or reactive airway disease and so I suggested a DuoNeb.  I would prefer to avoid steroids that she reports that they cause mood swings and behavioral changes and she does have a history of mental health and bipolar disorder.  I believe this is likely viral upper respiratory infection advised conservative care ongoing for the next 3 to 4 days.  We can prescribe Tessalon  Perles as needed at home.  No indication for antibiotics.  Doubt bacterial infection.  Doubt acute PE.  Chest x-ray was personally reviewed, with no emergent findings     Final diagnoses:  Viral URI with cough    ED Discharge Orders          Ordered    benzonatate  (TESSALON ) 100 MG capsule  3 times daily PRN        11/04/24 1809               Cottie Cough PARAS, MD 11/04/24 [phone]

## 2024-11-04 NOTE — Discharge Instructions (Addendum)
 You can take 2 puffs of your inhaler every 4 hours while you are awake for the next 2 to 3 days as needed for symptoms.  You can also use 1 spray of Afrin in each nostril at night to help with your postnasal drip.  Do not use Afrin more than 3 nights in a row.
